# Patient Record
Sex: Female | Born: 1955
Health system: Southern US, Community
[De-identification: ages and names within clinical notes are randomized; demographics above are authoritative.]

## PROBLEM LIST (undated history)

## (undated) DIAGNOSIS — M810 Age-related osteoporosis without current pathological fracture: Secondary | ICD-10-CM

## (undated) DIAGNOSIS — G20 Parkinson's disease: Secondary | ICD-10-CM

## (undated) DIAGNOSIS — F411 Generalized anxiety disorder: Secondary | ICD-10-CM

## (undated) DIAGNOSIS — E7522 Gaucher disease: Secondary | ICD-10-CM

## (undated) HISTORY — PX: AUGMENTATION MAMMAPLASTY: SUR837

## (undated) HISTORY — PX: OTHER SURGICAL HISTORY: SHX169

## (undated) HISTORY — DX: Gaucher disease: E75.22

## (undated) HISTORY — DX: Age-related osteoporosis without current pathological fracture: M81.0

## (undated) HISTORY — PX: NOSE SURGERY: SHX723

## (undated) HISTORY — DX: Generalized anxiety disorder: F41.1

## (undated) HISTORY — PX: COSMETIC SURGERY: SHX468

## (undated) HISTORY — PX: ABDOMINAL HYSTERECTOMY: SHX81

---

## 2013-07-20 ENCOUNTER — Ambulatory Visit (INDEPENDENT_AMBULATORY_CARE_PROVIDER_SITE_OTHER): Payer: BC Managed Care – PPO | Admitting: Emergency Medicine

## 2013-07-20 VITALS — BP 98/62 | HR 73 | Temp 97.5°F | Resp 16 | Ht 69.0 in | Wt 128.1 lb

## 2013-07-20 DIAGNOSIS — H66009 Acute suppurative otitis media without spontaneous rupture of ear drum, unspecified ear: Secondary | ICD-10-CM

## 2013-07-20 DIAGNOSIS — H8110 Benign paroxysmal vertigo, unspecified ear: Secondary | ICD-10-CM

## 2013-07-20 MED ORDER — MECLIZINE HCL 25 MG PO TABS: 25.0000 mg | ORAL_TABLET | Freq: Three times a day (TID) | ORAL | Status: DC | PRN

## 2013-07-20 MED ORDER — AMOXICILLIN-POT CLAVULANATE 875-125 MG PO TABS: 1.0000 | ORAL_TABLET | Freq: Two times a day (BID) | ORAL | Status: DC

## 2013-07-20 MED ORDER — PSEUDOEPHEDRINE-GUAIFENESIN ER 60-600 MG PO TB12: 1.0000 | ORAL_TABLET | Freq: Two times a day (BID) | ORAL | Status: AC

## 2013-07-20 NOTE — Progress Notes (Signed)
[facility] [address] [place] [address] [phone]  Date:  07/20/2013   Name:  Caitlin Braun   DOB:  March 11, 1956   MRN:  [id number]  PCP:  No primary provider on file.    Chief Complaint: Otalgia, Dizziness and Tinnitus   History of Present Illness:  Caitlin Braun is a 58 y.o. very pleasant female patient who presents with the following:  Ill for a week.  Treated at [facility] for strep screen positive with Pen VK.  Improved but developed pressure in ears and moved into only right ear with pressure and fullness.  Became dizzy.  Worse with change in axis of head.  No fever or chills.  No nausea or vomiting.  No cough or coryza.  No improvement with over the counter medications or other home remedies. Denies other complaint or health concern today.   There are no active problems to display for this patient.   No past medical history on file.  Past Surgical History  Procedure Laterality Date  . Cosmetic surgery    . Abdominal hysterectomy      History  Substance Use Topics  . Smoking status: Never Smoker   . Smokeless tobacco: Not on file  . Alcohol Use: Yes    Family History  Problem Relation Age of Onset  . Cancer Mother   . Cancer Father   . Cancer Brother     No Known Allergies  Medication list has been reviewed and updated.  No current outpatient prescriptions on file prior to visit.   No current facility-administered medications on file prior to visit.    Review of Systems:  As per HPI, otherwise negative.    Physical Examination: Filed Vitals:   07/20/13 1544  BP: 98/62  Pulse: 73  Temp: 97.5 F (36.4 C)  Resp: 16   Filed Vitals:   07/20/13 1544  Height: 5\' 9"  (1.753 m)  Weight: 128 lb 1.6 oz (58.106 kg)   Body mass index is 18.91 kg/(m^2). Ideal Body Weight: Weight in (lb) to have BMI = 25: 168.9  GEN: WDWN, NAD, Non-toxic, A & O x 3  HEENT: Atraumatic, Normocephalic. Neck supple. No masses, No LAD. Ears  and Nose: No external deformity.  Right TM bulging but not red CV: RRR, No M/G/R. No JVD. No thrill. No extra heart sounds. PULM: CTA B, no wheezes, crackles, rhonchi. No retractions. No resp. distress. No accessory muscle use. ABD: S, NT, ND, +BS. No rebound. No HSM. EXTR: No c/c/e NEURO Normal gait. Intact romberg.  Impaired tandem gait.  PRRERLA EOMI CN2-12 intact gross motor intact PSYCH: Normally interactive. Conversant. Not depressed or anxious appearing.  Calm demeanor.    Assessment and Plan: Otitis media mucinex d augmentin BPV antivert  Signed,  Ellison Carwin, MD

## 2013-07-20 NOTE — Patient Instructions (Signed)

## 2014-09-08 ENCOUNTER — Other Ambulatory Visit: Payer: Self-pay

## 2014-09-08 ENCOUNTER — Other Ambulatory Visit: Payer: Self-pay | Admitting: Specialist

## 2014-09-08 ENCOUNTER — Other Ambulatory Visit: Payer: Self-pay | Admitting: Obstetrics and Gynecology

## 2014-09-08 DIAGNOSIS — Z1231 Encounter for screening mammogram for malignant neoplasm of breast: Secondary | ICD-10-CM

## 2014-09-13 ENCOUNTER — Encounter (INDEPENDENT_AMBULATORY_CARE_PROVIDER_SITE_OTHER): Payer: Self-pay

## 2014-09-13 ENCOUNTER — Ambulatory Visit
Admission: RE | Admit: 2014-09-13 | Discharge: 2014-09-13 | Disposition: A | Discharge status: Home / Self Care | Payer: BLUE CROSS/BLUE SHIELD | Source: Ambulatory Visit

## 2014-09-13 DIAGNOSIS — Z1231 Encounter for screening mammogram for malignant neoplasm of breast: Secondary | ICD-10-CM

## 2015-05-28 ENCOUNTER — Other Ambulatory Visit: Payer: Self-pay | Admitting: Obstetrics and Gynecology

## 2015-05-28 DIAGNOSIS — N951 Menopausal and female climacteric states: Secondary | ICD-10-CM

## 2015-06-14 ENCOUNTER — Ambulatory Visit
Admission: RE | Admit: 2015-06-14 | Discharge: 2015-06-14 | Disposition: A | Discharge status: Home / Self Care | Payer: 59 | Source: Ambulatory Visit | Attending: Obstetrics and Gynecology | Admitting: Obstetrics and Gynecology

## 2015-06-14 DIAGNOSIS — N951 Menopausal and female climacteric states: Secondary | ICD-10-CM

## 2015-08-15 ENCOUNTER — Other Ambulatory Visit: Payer: Self-pay

## 2015-08-15 DIAGNOSIS — Z1231 Encounter for screening mammogram for malignant neoplasm of breast: Secondary | ICD-10-CM

## 2015-09-18 ENCOUNTER — Ambulatory Visit
Admission: RE | Admit: 2015-09-18 | Discharge: 2015-09-18 | Disposition: A | Discharge status: Home / Self Care | Payer: 59 | Source: Ambulatory Visit

## 2015-09-18 DIAGNOSIS — Z1231 Encounter for screening mammogram for malignant neoplasm of breast: Secondary | ICD-10-CM

## 2016-05-29 DIAGNOSIS — D224 Melanocytic nevi of scalp and neck: Secondary | ICD-10-CM | POA: Diagnosis not present

## 2016-05-29 DIAGNOSIS — D225 Melanocytic nevi of trunk: Secondary | ICD-10-CM | POA: Diagnosis not present

## 2016-05-29 DIAGNOSIS — L608 Other nail disorders: Secondary | ICD-10-CM | POA: Diagnosis not present

## 2016-05-29 DIAGNOSIS — D1801 Hemangioma of skin and subcutaneous tissue: Secondary | ICD-10-CM | POA: Diagnosis not present

## 2016-05-29 DIAGNOSIS — B351 Tinea unguium: Secondary | ICD-10-CM | POA: Diagnosis not present

## 2016-06-19 DIAGNOSIS — N390 Urinary tract infection, site not specified: Secondary | ICD-10-CM | POA: Diagnosis not present

## 2016-06-19 DIAGNOSIS — N760 Acute vaginitis: Secondary | ICD-10-CM | POA: Diagnosis not present

## 2016-07-01 ENCOUNTER — Other Ambulatory Visit (HOSPITAL_COMMUNITY): Payer: Self-pay | Admitting: Obstetrics and Gynecology

## 2016-07-01 ENCOUNTER — Ambulatory Visit (HOSPITAL_COMMUNITY)
Admission: RE | Admit: 2016-07-01 | Discharge: 2016-07-01 | Disposition: A | Discharge status: Home / Self Care | Payer: 59 | Source: Ambulatory Visit | Attending: Obstetrics and Gynecology | Admitting: Obstetrics and Gynecology

## 2016-07-01 ENCOUNTER — Other Ambulatory Visit (HOSPITAL_COMMUNITY): Payer: Self-pay

## 2016-07-01 DIAGNOSIS — Z01419 Encounter for gynecological examination (general) (routine) without abnormal findings: Secondary | ICD-10-CM | POA: Diagnosis not present

## 2016-07-01 DIAGNOSIS — Z801 Family history of malignant neoplasm of trachea, bronchus and lung: Secondary | ICD-10-CM | POA: Diagnosis not present

## 2016-07-01 DIAGNOSIS — Z0000 Encounter for general adult medical examination without abnormal findings: Secondary | ICD-10-CM | POA: Diagnosis not present

## 2016-08-11 ENCOUNTER — Other Ambulatory Visit: Payer: Self-pay | Admitting: Specialist

## 2016-08-11 DIAGNOSIS — Z1231 Encounter for screening mammogram for malignant neoplasm of breast: Secondary | ICD-10-CM

## 2016-09-19 ENCOUNTER — Ambulatory Visit: Payer: 59

## 2016-09-22 ENCOUNTER — Ambulatory Visit
Admission: RE | Admit: 2016-09-22 | Discharge: 2016-09-22 | Disposition: A | Discharge status: Home / Self Care | Payer: 59 | Source: Ambulatory Visit | Attending: Specialist | Admitting: Specialist

## 2016-09-22 DIAGNOSIS — Z1231 Encounter for screening mammogram for malignant neoplasm of breast: Secondary | ICD-10-CM

## 2016-10-06 DIAGNOSIS — R531 Weakness: Secondary | ICD-10-CM | POA: Diagnosis not present

## 2016-10-06 DIAGNOSIS — N390 Urinary tract infection, site not specified: Secondary | ICD-10-CM | POA: Diagnosis not present

## 2016-10-06 DIAGNOSIS — E878 Other disorders of electrolyte and fluid balance, not elsewhere classified: Secondary | ICD-10-CM | POA: Diagnosis not present

## 2016-10-06 DIAGNOSIS — E039 Hypothyroidism, unspecified: Secondary | ICD-10-CM | POA: Diagnosis not present

## 2016-10-06 DIAGNOSIS — R739 Hyperglycemia, unspecified: Secondary | ICD-10-CM | POA: Diagnosis not present

## 2016-10-13 DIAGNOSIS — M25242 Flail joint, left hand: Secondary | ICD-10-CM | POA: Diagnosis not present

## 2016-10-13 DIAGNOSIS — M25241 Flail joint, right hand: Secondary | ICD-10-CM | POA: Diagnosis not present

## 2016-10-16 DIAGNOSIS — M249 Joint derangement, unspecified: Secondary | ICD-10-CM | POA: Diagnosis not present

## 2016-10-16 DIAGNOSIS — R5383 Other fatigue: Secondary | ICD-10-CM | POA: Diagnosis not present

## 2016-10-16 DIAGNOSIS — M2550 Pain in unspecified joint: Secondary | ICD-10-CM | POA: Diagnosis not present

## 2016-11-10 DIAGNOSIS — Z719 Counseling, unspecified: Secondary | ICD-10-CM | POA: Diagnosis not present

## 2016-11-17 DIAGNOSIS — Z800 Family history of malignant neoplasm of digestive organs: Secondary | ICD-10-CM | POA: Diagnosis not present

## 2016-11-17 DIAGNOSIS — D126 Benign neoplasm of colon, unspecified: Secondary | ICD-10-CM | POA: Diagnosis not present

## 2016-11-17 DIAGNOSIS — K635 Polyp of colon: Secondary | ICD-10-CM | POA: Diagnosis not present

## 2016-11-17 DIAGNOSIS — D122 Benign neoplasm of ascending colon: Secondary | ICD-10-CM | POA: Diagnosis not present

## 2016-11-17 DIAGNOSIS — Z1211 Encounter for screening for malignant neoplasm of colon: Secondary | ICD-10-CM | POA: Diagnosis not present

## 2016-12-05 DIAGNOSIS — Z23 Encounter for immunization: Secondary | ICD-10-CM | POA: Diagnosis not present

## 2017-02-04 DIAGNOSIS — R3121 Asymptomatic microscopic hematuria: Secondary | ICD-10-CM | POA: Diagnosis not present

## 2017-03-21 DIAGNOSIS — R251 Tremor, unspecified: Secondary | ICD-10-CM | POA: Diagnosis not present

## 2017-03-25 DIAGNOSIS — R531 Weakness: Secondary | ICD-10-CM | POA: Diagnosis not present

## 2017-03-25 DIAGNOSIS — Z131 Encounter for screening for diabetes mellitus: Secondary | ICD-10-CM | POA: Diagnosis not present

## 2017-03-25 DIAGNOSIS — E7800 Pure hypercholesterolemia, unspecified: Secondary | ICD-10-CM | POA: Diagnosis not present

## 2017-04-10 DIAGNOSIS — R5383 Other fatigue: Secondary | ICD-10-CM | POA: Diagnosis not present

## 2017-04-10 DIAGNOSIS — R970 Elevated carcinoembryonic antigen [CEA]: Secondary | ICD-10-CM | POA: Diagnosis not present

## 2017-04-10 DIAGNOSIS — R531 Weakness: Secondary | ICD-10-CM | POA: Diagnosis not present

## 2017-04-10 DIAGNOSIS — N390 Urinary tract infection, site not specified: Secondary | ICD-10-CM | POA: Diagnosis not present

## 2017-04-10 DIAGNOSIS — J309 Allergic rhinitis, unspecified: Secondary | ICD-10-CM | POA: Diagnosis not present

## 2017-04-10 DIAGNOSIS — E7800 Pure hypercholesterolemia, unspecified: Secondary | ICD-10-CM | POA: Diagnosis not present

## 2017-05-01 DIAGNOSIS — R21 Rash and other nonspecific skin eruption: Secondary | ICD-10-CM | POA: Diagnosis not present

## 2017-05-01 DIAGNOSIS — L814 Other melanin hyperpigmentation: Secondary | ICD-10-CM | POA: Diagnosis not present

## 2017-05-05 DIAGNOSIS — J3089 Other allergic rhinitis: Secondary | ICD-10-CM | POA: Diagnosis not present

## 2017-05-05 DIAGNOSIS — J301 Allergic rhinitis due to pollen: Secondary | ICD-10-CM | POA: Diagnosis not present

## 2017-05-05 DIAGNOSIS — H1045 Other chronic allergic conjunctivitis: Secondary | ICD-10-CM | POA: Diagnosis not present

## 2017-05-06 DIAGNOSIS — Z23 Encounter for immunization: Secondary | ICD-10-CM | POA: Diagnosis not present

## 2017-06-02 DIAGNOSIS — D2262 Melanocytic nevi of left upper limb, including shoulder: Secondary | ICD-10-CM | POA: Diagnosis not present

## 2017-06-02 DIAGNOSIS — L821 Other seborrheic keratosis: Secondary | ICD-10-CM | POA: Diagnosis not present

## 2017-06-02 DIAGNOSIS — L720 Epidermal cyst: Secondary | ICD-10-CM | POA: Diagnosis not present

## 2017-06-05 DIAGNOSIS — H00011 Hordeolum externum right upper eyelid: Secondary | ICD-10-CM | POA: Diagnosis not present

## 2017-06-24 DIAGNOSIS — N3281 Overactive bladder: Secondary | ICD-10-CM | POA: Diagnosis not present

## 2017-06-24 DIAGNOSIS — R3121 Asymptomatic microscopic hematuria: Secondary | ICD-10-CM | POA: Diagnosis not present

## 2017-06-24 DIAGNOSIS — Z23 Encounter for immunization: Secondary | ICD-10-CM | POA: Diagnosis not present

## 2017-07-03 DIAGNOSIS — N3281 Overactive bladder: Secondary | ICD-10-CM | POA: Diagnosis not present

## 2017-07-08 ENCOUNTER — Other Ambulatory Visit: Payer: Self-pay | Admitting: Obstetrics and Gynecology

## 2017-07-08 DIAGNOSIS — N952 Postmenopausal atrophic vaginitis: Secondary | ICD-10-CM | POA: Diagnosis not present

## 2017-07-08 DIAGNOSIS — N6002 Solitary cyst of left breast: Secondary | ICD-10-CM

## 2017-07-08 DIAGNOSIS — Z01419 Encounter for gynecological examination (general) (routine) without abnormal findings: Secondary | ICD-10-CM | POA: Diagnosis not present

## 2017-07-10 ENCOUNTER — Ambulatory Visit
Admission: RE | Admit: 2017-07-10 | Discharge: 2017-07-10 | Disposition: A | Discharge status: Home / Self Care | Payer: 59 | Source: Ambulatory Visit | Attending: Obstetrics and Gynecology | Admitting: Obstetrics and Gynecology

## 2017-07-10 DIAGNOSIS — N6002 Solitary cyst of left breast: Secondary | ICD-10-CM

## 2017-07-10 DIAGNOSIS — R3121 Asymptomatic microscopic hematuria: Secondary | ICD-10-CM | POA: Diagnosis not present

## 2017-07-10 DIAGNOSIS — N6489 Other specified disorders of breast: Secondary | ICD-10-CM | POA: Diagnosis not present

## 2017-07-10 DIAGNOSIS — R922 Inconclusive mammogram: Secondary | ICD-10-CM | POA: Diagnosis not present

## 2017-08-21 ENCOUNTER — Other Ambulatory Visit: Payer: Self-pay | Admitting: Obstetrics and Gynecology

## 2017-08-21 DIAGNOSIS — Z1231 Encounter for screening mammogram for malignant neoplasm of breast: Secondary | ICD-10-CM

## 2017-09-25 ENCOUNTER — Ambulatory Visit
Admission: RE | Admit: 2017-09-25 | Discharge: 2017-09-25 | Disposition: A | Discharge status: Home / Self Care | Payer: 59 | Source: Ambulatory Visit | Attending: Obstetrics and Gynecology | Admitting: Obstetrics and Gynecology

## 2017-09-25 DIAGNOSIS — Z1231 Encounter for screening mammogram for malignant neoplasm of breast: Secondary | ICD-10-CM | POA: Diagnosis not present

## 2017-09-29 ENCOUNTER — Other Ambulatory Visit: Payer: Self-pay | Admitting: Obstetrics and Gynecology

## 2017-09-29 DIAGNOSIS — R928 Other abnormal and inconclusive findings on diagnostic imaging of breast: Secondary | ICD-10-CM

## 2017-10-02 ENCOUNTER — Ambulatory Visit
Admission: RE | Admit: 2017-10-02 | Discharge: 2017-10-02 | Disposition: A | Discharge status: Home / Self Care | Payer: 59 | Source: Ambulatory Visit | Attending: Obstetrics and Gynecology | Admitting: Obstetrics and Gynecology

## 2017-10-02 DIAGNOSIS — R928 Other abnormal and inconclusive findings on diagnostic imaging of breast: Secondary | ICD-10-CM

## 2017-10-02 DIAGNOSIS — N6489 Other specified disorders of breast: Secondary | ICD-10-CM | POA: Diagnosis not present

## 2017-10-20 DIAGNOSIS — H10502 Unspecified blepharoconjunctivitis, left eye: Secondary | ICD-10-CM | POA: Diagnosis not present

## 2017-10-28 DIAGNOSIS — M249 Joint derangement, unspecified: Secondary | ICD-10-CM | POA: Diagnosis not present

## 2017-10-28 DIAGNOSIS — M2550 Pain in unspecified joint: Secondary | ICD-10-CM | POA: Diagnosis not present

## 2017-10-28 DIAGNOSIS — R5383 Other fatigue: Secondary | ICD-10-CM | POA: Diagnosis not present

## 2017-10-30 DIAGNOSIS — Z23 Encounter for immunization: Secondary | ICD-10-CM | POA: Diagnosis not present

## 2017-11-17 DIAGNOSIS — H04123 Dry eye syndrome of bilateral lacrimal glands: Secondary | ICD-10-CM | POA: Diagnosis not present

## 2017-12-11 DIAGNOSIS — Z23 Encounter for immunization: Secondary | ICD-10-CM | POA: Diagnosis not present

## 2017-12-22 DIAGNOSIS — R300 Dysuria: Secondary | ICD-10-CM | POA: Diagnosis not present

## 2018-01-13 DIAGNOSIS — M79601 Pain in right arm: Secondary | ICD-10-CM | POA: Diagnosis not present

## 2018-01-13 DIAGNOSIS — R29898 Other symptoms and signs involving the musculoskeletal system: Secondary | ICD-10-CM | POA: Diagnosis not present

## 2018-01-13 DIAGNOSIS — R200 Anesthesia of skin: Secondary | ICD-10-CM | POA: Diagnosis not present

## 2018-01-15 DIAGNOSIS — R200 Anesthesia of skin: Secondary | ICD-10-CM | POA: Diagnosis not present

## 2018-01-15 DIAGNOSIS — M79601 Pain in right arm: Secondary | ICD-10-CM | POA: Diagnosis not present

## 2018-01-15 DIAGNOSIS — R29898 Other symptoms and signs involving the musculoskeletal system: Secondary | ICD-10-CM | POA: Diagnosis not present

## 2018-01-27 DIAGNOSIS — M5416 Radiculopathy, lumbar region: Secondary | ICD-10-CM | POA: Diagnosis not present

## 2018-01-27 DIAGNOSIS — M542 Cervicalgia: Secondary | ICD-10-CM | POA: Diagnosis not present

## 2018-01-27 DIAGNOSIS — R29898 Other symptoms and signs involving the musculoskeletal system: Secondary | ICD-10-CM | POA: Diagnosis not present

## 2018-01-27 DIAGNOSIS — M545 Low back pain: Secondary | ICD-10-CM | POA: Diagnosis not present

## 2018-02-24 DIAGNOSIS — R29898 Other symptoms and signs involving the musculoskeletal system: Secondary | ICD-10-CM | POA: Diagnosis not present

## 2018-02-24 DIAGNOSIS — M5416 Radiculopathy, lumbar region: Secondary | ICD-10-CM | POA: Diagnosis not present

## 2018-02-24 DIAGNOSIS — M545 Low back pain: Secondary | ICD-10-CM | POA: Diagnosis not present

## 2018-03-02 ENCOUNTER — Encounter: Payer: Self-pay | Admitting: *Deleted

## 2018-03-02 ENCOUNTER — Encounter: Payer: Self-pay | Admitting: Neurology

## 2018-03-02 ENCOUNTER — Ambulatory Visit: Payer: 59 | Admitting: Neurology

## 2018-03-02 VITALS — BP 91/63 | HR 80 | Ht 70.0 in | Wt 121.0 lb

## 2018-03-02 DIAGNOSIS — R200 Anesthesia of skin: Secondary | ICD-10-CM

## 2018-03-02 DIAGNOSIS — E519 Thiamine deficiency, unspecified: Secondary | ICD-10-CM

## 2018-03-02 DIAGNOSIS — G8929 Other chronic pain: Secondary | ICD-10-CM

## 2018-03-02 DIAGNOSIS — M542 Cervicalgia: Secondary | ICD-10-CM

## 2018-03-02 DIAGNOSIS — M79644 Pain in right finger(s): Secondary | ICD-10-CM

## 2018-03-02 DIAGNOSIS — G20C Parkinsonism, unspecified: Secondary | ICD-10-CM

## 2018-03-02 DIAGNOSIS — M6281 Muscle weakness (generalized): Secondary | ICD-10-CM | POA: Diagnosis not present

## 2018-03-02 DIAGNOSIS — R270 Ataxia, unspecified: Secondary | ICD-10-CM

## 2018-03-02 DIAGNOSIS — Q8740 Marfan's syndrome, unspecified: Secondary | ICD-10-CM

## 2018-03-02 DIAGNOSIS — G20 Parkinson's disease: Secondary | ICD-10-CM

## 2018-03-02 DIAGNOSIS — R011 Cardiac murmur, unspecified: Secondary | ICD-10-CM

## 2018-03-02 DIAGNOSIS — R5382 Chronic fatigue, unspecified: Secondary | ICD-10-CM

## 2018-03-02 DIAGNOSIS — G1229 Other motor neuron disease: Secondary | ICD-10-CM

## 2018-03-02 DIAGNOSIS — R202 Paresthesia of skin: Secondary | ICD-10-CM

## 2018-03-02 DIAGNOSIS — R531 Weakness: Secondary | ICD-10-CM | POA: Diagnosis not present

## 2018-03-02 DIAGNOSIS — M5440 Lumbago with sciatica, unspecified side: Secondary | ICD-10-CM

## 2018-03-02 DIAGNOSIS — M20001 Unspecified deformity of right finger(s): Secondary | ICD-10-CM

## 2018-03-02 DIAGNOSIS — G1221 Amyotrophic lateral sclerosis: Secondary | ICD-10-CM

## 2018-03-02 DIAGNOSIS — R251 Tremor, unspecified: Secondary | ICD-10-CM

## 2018-03-02 DIAGNOSIS — M7918 Myalgia, other site: Secondary | ICD-10-CM

## 2018-03-02 DIAGNOSIS — E531 Pyridoxine deficiency: Secondary | ICD-10-CM

## 2018-03-02 DIAGNOSIS — E538 Deficiency of other specified B group vitamins: Secondary | ICD-10-CM

## 2018-03-02 DIAGNOSIS — E559 Vitamin D deficiency, unspecified: Secondary | ICD-10-CM

## 2018-03-02 NOTE — Patient Instructions (Addendum)
MRI of the brain and cervical spine w/wo contrast please ensure to go down to T2 to see finding in vertebra on last non-contrast MRI Right rhomboid and trap tightness, heat and massage feels good. Dry needling EMG/NCS of the right arm and right leg Need to evaluate for MS given diffuse symptoms. MRI brain and cervical spine Marfan?: echocardiogram and f/u with pcp to discuss referral if clinically warranted and possibly genetic testing Consider DAT scan, very unlikely parkinson'd disease Tremor: may be essential tremor?  Extensive lab testing today Physical therapy for right-sided and pain and gait abnormality and proximal weakness: Mild generalized weakness more proximaly 4+/5 in the deltoids and hip flexors Xray of right hand

## 2018-03-02 NOTE — Progress Notes (Addendum)
[facility]    Provider:  Dr Jaynee Eagles Referring Provider: [name], MD, Dr. Erline Braun Primary Care Physician:  Caitlin Queen, MD  CC:  Right-sided numbness, tremor and tingling  Addendum 06/03/2018: DAT Scan: IMPRESSION:Loss of dopamine transport activity in the bilateral striata is a pattern typical of Parkinson's syndrome pathology  Addendum 05/20/2018: Patient with reported low back pain with radiculopathy of right leg, right arm and leg weakness and sensory changes, diffuse pain. Reviewed workup with patient today. EMG/NCS within normal limits. She still has episodes of leg tremoring but she is feeling better and if she tells herself to stop she can stop it. She also still feels symptomatic in her right arm and right leg. But symptoms happen separately so may not be related. She went to physical therapy and she loves it. She is going to start Pilates. Reviewed MRI of the brain and thoracic imaging and labwork to date with patient which is extensive (see below). She has already has MRI cervical spine and lumbar spine at [facility]. Her worst symptoms are the tingling and numbness in the prox arm and legs and the tremor. She asks me for a refill of Ambien, I will give her one refill of ambien but I do not agree with long-term use so she will have to see her primary care to discuss. Needs a referral to a sleep counselor.  Patient needs cognitive behavioral therapy for insomnia. Please refer to [facility]  MRI brain: This MRI of the brain with and without contrast shows the following:  1.   Brain parenchyma appears normal before and after contrast. 2.   7 to 8 mm focus in the right skull.  Surrounding skull appears normal.   This appears to be benign. She should repeat CT head in 6 months to a year to follow right skull focus, explained she should review with pcp and ask her to order as she may not need follow up in neurology. At this time she  is looking for a new pcp and understands it is her responsibility to follow up on this. MRI Thoracic Spine: This MRI of the thoracic spine with and without contrast shows the following: 1.   The spinal cord appears normal. 2.   Small disc protrusions at T7-T8, T8-T9 and T10-T11 that do not cause nerve root compression or spinal stenosis. 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 Showed a small pfo. Emailed Dr. Rayann Braun, no intervention needed.  Parkinsonism?: Pending DAT scan  Normal labs: Vit D, MMA, Lyme, tsh, HIV, esr, sjogrens, pan-anca, b12, folate, rpr, help c, paraneoplatic abs, celiac antibodies, heavy metals, b6, IFE/SPEP, copper, b1, cmo, cbc, ana   HPI:  Caitlin Braun is a 62 y.o. female here as requested by Dr. Erline Braun for low back pain.  She has been seen by [facility] and EMG nerve conduction study of the right upper extremity did not show any etiology for her symptoms. She has been extensively evaluated at [facility] with MRI c-spine and MRI L-spine and emg/ncs of the right arm. She has seen rheumatology. Here with her husband who provides much information. About 2 years she started feeling tired and lethargic, she was feeling joint pain diffuse. Then she started having right thumb pain and saw rheumatologist who allayed her fears about rheumatologic disease, diagnosed with hyper joints. She has had neck issues under her should blade on her right side and tingles and radiates down the right arm to the thumb. Massage and  heat helps. She has some tremor in the right arm. Also when she sits on the toilet her right leg tremors. Also her handwriting is affected. She has weakness in the right hand, her hand goes numb, she can hardly write. She has weakness in the right hand. No problems with smell or taste, but he appetite has decreased and taste has changed per husband. No drooling or wet pillows at night. She has imbalance. No REM sleep disorder. No falls. She denies  incontinence, She mumbles a lot this is not new but husband has hearing difficulties, no hypophonia. Husband has noticed some shuffling. No changes in facial expressions. She has back pain and radiation into the right leg. Numbness in the right leg. No other focal neurologic deficits, associated symptoms, inciting events or modifiable factors.  Reviewed notes, labs and imaging from outside physicians, which showed:  Reviewed notes from Dr. Vertell Limber in his office neurosurgery and spine.:  Patient has been experiencing right upper extremity neck pain and also numbness and tingling in digits 1 and 2.  Diminished dexterity in the right hand.  Past medical history is otherwise noncontributory.  She has right shoulder right arm and right some numbness and tingling.  Numbness is exacerbated by use.  She denies any neck pain.  She also notes some right leg numbness and tingling that begins in her right head and extends into her right outer toes.  She is noticed balance issues and handwriting issues over the last several months.  She also reports tremors in her right hand.  Her rheumatologist diagnosed hyperflexible right thumb and elevate her concern for autoimmune process.  On exam her neurologic exam is significant for decreased sensory in the right C6 distribution, Spurling's positive on the right, Tinel's positive on the right, Phalen's positive on the right.  She does have 4 out of 5 right hand intrinsics weakness and decreased pin sensitivity in the first and second digits on the right otherwise negative straight leg raise negative right sciatic notch discomfort.  Reviewed MRI of the cervical spine report I do not have imaging.  Impression is L4-L5 bilateral facet Orth osteoarthritis allowing 2 mm of anterolisthesis.  Mild bulging of the disc.  No compressive stenosis, the back arthritis would be a cause of back pain or referred pain.  Chronic appearing degenerative changes of the discs at L3-L4 and L5-S1 but  without herniation.  No stenosis or neural compression.  Exam date January 27, 2018.  MRI of the cervical spine reviewed report I do not have the images available: No significant degenerative changes, no stenosis or neural compression, no abnormality seen to explain neck pain and right arm symptoms.  T2 bright focus within the anterior right T1 vertebral body which may be a benign cyst would recommend, suggest a contrast-enhanced examination to rule out enhancing marrow space lesion.  Labs include normal thyroid panel, BUN 16 and creatinine 0.58 collected 03/25/2017, ANA negative, rheumatoid arthritis negative, sed rate negative, CEA normal, CA normal CBC was unremarkable April 11, 2017 except for mildly low platelets at 136.  Reviewed data and results of electrodiagnostic testing summary includes right upper extremity was within normal limits with no evidence for cervical radiculopathy, brachial plexopathy, peripheral median or ulnar neuropathy as well as polyneuropathy.  Per the note she could be experiencing some cervical radiculitis is not manifesting on electrodiagnostic testing, recommended MRI cervical spine.  Also recommended possibly repeating in the future.  Due to right arm numbness right arm weakness and right arm  pain.     Review of Systems: Patient complains of symptoms per HPI as well as the following symptoms: numbness, weakness, insomnia, ringing in ears, tremor, aching muscles . Pertinent negatives and positives per HPI. All others negative.   Social History   Socioeconomic History  . Marital status: Married    Spouse name: Not on file  . Number of children: 3  . Years of education: Not on file  . Highest education level: Bachelor's degree (e.g., BA, AB, BS)  Occupational History  . Not on file  Social Needs  . Financial resource strain: Not on file  . Food insecurity:    Worry: Not on file    Inability: Not on file  . Transportation needs:    Medical: Not on file     Non-medical: Not on file  Tobacco Use  . Smoking status: Never Smoker  . Smokeless tobacco: Never Used  Substance and Sexual Activity  . Alcohol use: Yes    Alcohol/week: 3.0 standard drinks    Types: 3 Standard drinks or equivalent per week  . Drug use: No  . Sexual activity: Not on file  Lifestyle  . Physical activity:    Days per week: Not on file    Minutes per session: Not on file  . Stress: Not on file  Relationships  . Social connections:    Talks on phone: Not on file    Gets together: Not on file    Attends religious service: Not on file    Active member of club or organization: Not on file    Attends meetings of clubs or organizations: Not on file    Relationship status: Not on file  . Intimate partner violence:    Fear of current or ex partner: Not on file    Emotionally abused: Not on file    Physically abused: Not on file    Forced sexual activity: Not on file  Other Topics Concern  . Not on file  Social History Narrative   Lives at home with husband    Family History  Problem Relation Age of Onset  . Cancer Mother   . Cancer Father   . Cancer Brother   . Breast cancer Neg Hx   . Parkinson's disease Neg Hx     Past Medical History:  Diagnosis Date  . Osteoporosis     Past Surgical History:  Procedure Laterality Date  . ABDOMINAL HYSTERECTOMY    . AUGMENTATION MAMMAPLASTY Bilateral 2016, 1998   Patient had them redone in 2016  . COSMETIC SURGERY    . face lift    . NOSE SURGERY     cosmetic    Current Outpatient Medications  Medication Sig Dispense Refill  . ALPRAZolam (XANAX) 0.25 MG tablet Take 0.25 mg by mouth as needed.     . Biotin w/ Vitamins C & E (HAIR/SKIN/NAILS PO) Take by mouth.    . Calcium Carb-Cholecalciferol (CALCIUM + D3 PO) Take 500 mg by mouth daily.    . Cholecalciferol (VITAMIN D3 PO) Take 2,000 Int'l Units by mouth daily.    Marland Kitchen Fexofenadine-Pseudoephedrine (ALLEGRA-D 12 HOUR PO) Take 1 tablet by mouth daily.     Marland Kitchen  FLUTICASONE PROPIONATE NA Place 1 spray into both nostrils at bedtime.    Marland Kitchen ketotifen (ZADITOR) 0.025 % ophthalmic solution Place 1 drop into both eyes as needed.    Marland Kitchen KRILL OIL PO Take 500 mg by mouth daily.    Marland Kitchen MAGNESIUM PO Take by  mouth at bedtime.    . Minoxidil (ROGAINE EX) Apply 2 drops topically daily.    . montelukast (SINGULAIR) 10 MG tablet Take 10 mg by mouth at bedtime.    . Multiple Vitamin (MULTIVITAMIN) tablet Take 1 tablet by mouth daily.    . Potassium Gluconate 550 MG TABS Take 1 tablet by mouth daily.    . Probiotic Product (PROBIOTIC PO) Take by mouth.    . Wheat Dextrin (BENEFIBER PO) Take by mouth.    . zolpidem (AMBIEN) 10 MG tablet Take 10 mg by mouth at bedtime.      No current facility-administered medications for this visit.     Allergies as of 03/02/2018 - Review Complete 03/02/2018  Allergen Reaction Noted  . Augmentin [amoxicillin-pot clavulanate] Hives 03/02/2018    Vitals: BP 91/63 (BP Location: Right Arm, Patient Position: Sitting)   Pulse 80   Ht '5\' 10"'  (1.778 m)   Wt 121 lb (54.9 kg)   BMI 17.36 kg/m  Last Weight:  Wt Readings from Last 1 Encounters:  03/02/18 121 lb (54.9 kg)   Last Height:   Ht Readings from Last 1 Encounters:  03/02/18 '5\' 10"'  (1.778 m)   Physical exam: Exam: Gen: NAD, conversant, well nourised, thin,, well groomed                     CV: RRR, +SEM. No Carotid Bruits. No peripheral edema, warm, nontender Eyes: Conjunctivae clear without exudates or hemorrhage MSK: tall, thin, thin and long fingers, joint laxity  Neuro: Detailed Neurologic Exam  Speech:    Speech is normal; fluent and spontaneous with normal comprehension.  Cognition:    The patient is oriented to person, place, and time;     recent and remote memory intact;     language fluent;     normal attention, concentration,     fund of knowledge Cranial Nerves: Hypomimia    The pupils are equal, round, and reactive to light. The fundi are normal and  spontaneous venous pulsations are present. Visual fields are full to finger confrontation. Extraocular movements are intact. Trigeminal sensation is intact and the muscles of mastication are normal. Bilateral ptosis. The palate elevates in the midline. Hearing intact. Voice is normal. Shoulder shrug is normal. The tongue has normal motion without fasciculations.   Coordination:    Normal finger to nose and heel to shin. Normal rapid alternating movements.   Gait:    Heel-toe and tandem gait are normal.  Decreased arm swing.   Motor Observation:    Postural tremor Tone:    Normal muscle tone.   Posture:    Posture is normal. normal erect    Strength: Mild generalized weakness more proximaly 4+/5 in the deltoids and hip flexors    Strength is V/V in the upper and lower limbs.      Sensation: intact to LT     Reflex Exam:  DTR's: right biceps slightly increased as compared to the left.  Brisk lowers for age.     Toes:    The toes are downgoing bilaterally.   Clonus:    2 beats clonus at AJs        Assessment/Plan:  63 year old female with multiple symptoms including chronic neck and back pain, radicular symptoms, fatigue, lethargy, diffuse pain, hyer joints, neck pain muscular, tremors, worsening weakness in right arm and hand and numbness, imbalance, hypomimia, decreased arm swing, numbnes sin right leg. She has already been extensively evaluated without  causes found  Addendum 06/03/2018: DAT Scan: IMPRESSION:Loss of dopamine transport activity in the bilateral striata is a pattern typical of Parkinson's syndrome pathology  Addendum 05/20/2018: Patient with reported low back pain with radiculopathy of right leg, right arm and leg weakness and sensory changes, diffuse pain. Reviewed workup with patient today. EMG/NCS within normal limits. She still has episodes of leg tremoring but she is feeling better and if she tells herself to stop she can stop it. She also still feels  symptomatic in her right arm and right leg. But symptoms happen separately so may not be related. She went to physical therapy and she loves it. She is going to start Pilates. Reviewed MRI of the brain and thoracic imaging and labwork to date with patient which is extensive (see below). She has already has MRI cervical spine and lumbar spine at [facility]. Her worst symptoms are the tingling and numbness in the prox arm and legs and the tremor. She asks me for a refill of Ambien, I will give her one refill of ambien but I do not agree with long-term use so she will have to see her primary care to discuss. Needs a referral to a sleep counselor.  Patient needs cognitive behavioral therapy for insomnia. Please refer to [facility]  MRI brain: This MRI of the brain with and without contrast shows the following:  1.   Brain parenchyma appears normal before and after contrast. 2.   7 to 8 mm focus in the right skull.  Surrounding skull appears normal.   This appears to be benign. She should repeat CT head in 6 months to a year to follow right skull focus, explained she should review with pcp and ask her to order as she may not need follow up in neurology. At this time she is looking for a new pcp and understands it is her responsibility to follow up on this. MRI Thoracic Spine: This MRI of the thoracic spine with and without contrast shows the following: 1.   The spinal cord appears normal. 2.   Small disc protrusions at T7-T8, T8-T9 and T10-T11 that do not cause nerve root compression or spinal stenosis. 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 Showed a small pfo. Emailed Dr. Rayann Braun, no intervention needed.  Parkinsonism?: Pending DAT scan  Normal labs: Vit D, MMA, Lyme, tsh, HIV, esr, sjogrens, pan-anca, b12, folate, rpr, help c, paraneoplatic abs, celiac antibodies, heavy metals, b6, IFE/SPEP, copper, b1, cmo, cbc, ana  Initial Assessment and plan:  - Need to evaluate for  MS given diffuse symptoms. MRI brain and cervical spine w/wo contrast - MRI of the brain to evaluate for MS, stroke, masses or other lesions causing her focal neurologic symptoms. Also MRI cervical and thoracic spine w/wo contrast to evaluate for  T2 bright focus within the anterior right T1 vertebral body which may be a benign cyst but suggest a contrast-enhanced examination to rule out enhancing marrow space lesion or other lesions. - Right rhomboid and trap tightness, heat and massage feels good. Dry needling for cervical myofascial pain - EMG/NCS of the right arm and right leg - Phenotypically she appears Marfan-like?: echocardiogram and f/u with pcp to discuss referral if clinically warranted and possibly genetic testing - Consider DAT scan, parkinsonism on exam - Tremor: may be essential tremor? MRI brain and cervical spine - Extensive lab testing today - Physical therapy for right-sided and pain and gait abnormality and proximal weakness: Mild generalized weakness more proximaly 4+/5 in  the deltoids and hip flexors - A wrist splint to support right wrist and right thumb. - Xray of right hand  Orders Placed This Encounter  Procedures  . MR BRAIN W WO CONTRAST  . MR CERVICAL SPINE W WO CONTRAST  . MR THORACIC SPINE W WO CONTRAST  . DG Hand Complete Right  . Vitamin D, 25-hydroxy  . Methylmalonic acid, serum  . B. burgdorfi Antibody  . TSH  . HIV Antibody (routine testing w rflx)  . Sedimentation rate  . Sjogren's syndrome antibods(ssa + ssb)  . Pan-ANCA  . B12 and Folate Panel  . RPR  . Hepatitis C antibody  . Paraneoplastic Profile 1  . Tissue transglutaminase, IgA  . Gliadin antibodies, serum  . Heavy metals, blood  . Vitamin B6  . Multiple Myeloma Panel (SPEP&IFE w/QIG)  . Copper, serum  . Vitamin B1  . Comprehensive metabolic panel  . CBC  . ANA,IFA RA Diag Pnl w/rflx Tit/Patn  . Ambulatory referral to Physical Therapy  . Ambulatory referral to Physical Therapy  .  ECHOCARDIOGRAM COMPLETE BUBBLE STUDY  . NCV with EMG(electromyography)    Cc: Caitlin Queen, MD, Dr. Erline Braun  A total of 120 minutes was spent face-to-face with this patient. Over half this time was spent on counseling patient on the  1. Right sided weakness   2. Numbness and tingling of right arm and leg   3. Parkinsonism, unspecified Parkinsonism type ([place])   4. Muscle weakness (generalized)   5. Tremors of nervous system   6. Deformity of right thumb joint   7. Bilateral low back pain with sciatica, sciatica laterality unspecified, unspecified chronicity   8. Myofascial pain syndrome, cervical   9. Chronic pain of right thumb   10. Chronic neck pain   11. Paresthesias   12. B12 deficiency   13. Chronic fatigue   14. Vitamin B1 deficiency   15. Vitamin B6 deficiency   16. Vitamin D deficiency   17. 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 Marfan syndrome   20. Murmur, cardiac     diagnosis and different diagnostic and therapeutic options, counseling and coordination of care, risks ans benefits of management, compliance, or risk factor reduction and education.      Sarina Ill, MD  [facility] [address] [place], [place] [address]  Phone [phone] Fax [phone]

## 2018-03-02 NOTE — Addendum Note (Signed)
Addended by: Sarina Ill B on: 03/02/2018 09:40 PM   Modules accepted: Orders

## 2018-03-08 ENCOUNTER — Telehealth: Payer: Self-pay | Admitting: Neurology

## 2018-03-08 NOTE — Telephone Encounter (Signed)
The Brain was approved the Thoracic is pending I faxed clinical notes for that one.  But the Cervical can be approved if the order is switch to MRI Cervical wo contrast. Do you want to change the order or proceed with me faxing the clinical notes for the MRI cervical w/wo contrast?

## 2018-03-09 NOTE — Telephone Encounter (Signed)
Okay the Thoracic is still pending. I am waiting on the nurse to review the case.

## 2018-03-09 NOTE — Telephone Encounter (Signed)
I don;t need a cervical without contrast, she already had one. I'd cancel the cervical and see if they approve the thoracic with contrast thanks

## 2018-03-09 NOTE — Telephone Encounter (Signed)
Since [facility] would approve the Cervical wo contrast I switch the CPT code with them to get it approved . When you get a chance can you put a new order in for Cervical wo contrast.

## 2018-03-10 ENCOUNTER — Other Ambulatory Visit (HOSPITAL_COMMUNITY): Payer: 59

## 2018-03-10 ENCOUNTER — Telehealth: Payer: Self-pay | Admitting: *Deleted

## 2018-03-10 LAB — VITAMIN B1: Thiamine: 115 nmol/L (ref 66.5–200.0)

## 2018-03-10 LAB — PARANEOPLASTIC PROFILE 1
Neuronal Nuc Ab (Ri), IFA: <1:10 {titer}
Purkinje Cell (Yo) Autoantobodies- IFA: <1:10 {titer}

## 2018-03-10 LAB — ANA,IFA RA DIAG PNL W/RFLX TIT/PATN
ANA Titer 1: NEGATIVE
CYCLIC CITRULLIN PEPTIDE AB: 14 U (ref 0–19)
Rhuematoid fact SerPl-aCnc: 11.3 IU/mL (ref 0.0–13.9)

## 2018-03-10 LAB — VITAMIN B6: VITAMIN B6: 19.7 ug/L (ref 2.0–32.8)

## 2018-03-10 LAB — MULTIPLE MYELOMA PANEL, SERUM
ALBUMIN SERPL ELPH-MCNC: 4.5 g/dL — AB (ref 2.9–4.4)
ALPHA 1: 0.2 g/dL (ref 0.0–0.4)
ALPHA2 GLOB SERPL ELPH-MCNC: 0.6 g/dL (ref 0.4–1.0)
Albumin/Glob SerPl: 1.6 (ref 0.7–1.7)
B-Globulin SerPl Elph-Mcnc: 0.8 g/dL (ref 0.7–1.3)
GAMMA GLOB SERPL ELPH-MCNC: 1.5 g/dL (ref 0.4–1.8)
Globulin, Total: 3 g/dL (ref 2.2–3.9)
IGG (IMMUNOGLOBIN G), SERUM: 1273 mg/dL (ref 700–1600)
IGM (IMMUNOGLOBULIN M), SRM: 333 mg/dL — AB (ref 26–217)
IgA/Immunoglobulin A, Serum: 163 mg/dL (ref 87–352)

## 2018-03-10 LAB — CBC
HEMATOCRIT: 36.5 % (ref 34.0–46.6)
HEMOGLOBIN: 11.8 g/dL (ref 11.1–15.9)
MCH: 28.4 pg (ref 26.6–33.0)
MCHC: 32.3 g/dL (ref 31.5–35.7)
MCV: 88 fL (ref 79–97)
PLATELETS: 146 10*3/uL — AB (ref 150–450)
RBC: 4.16 x10E6/uL (ref 3.77–5.28)
RDW: 13.7 % (ref 12.3–15.4)
WBC: 5.3 10*3/uL (ref 3.4–10.8)

## 2018-03-10 LAB — COMPREHENSIVE METABOLIC PANEL
ALBUMIN: 5 g/dL — AB (ref 3.6–4.8)
ALT: 13 IU/L (ref 0–32)
AST: 21 IU/L (ref 0–40)
Albumin/Globulin Ratio: 2 (ref 1.2–2.2)
Alkaline Phosphatase: 66 IU/L (ref 39–117)
BUN/Creatinine Ratio: 28 (ref 12–28)
BUN: 17 mg/dL (ref 8–27)
Bilirubin Total: 0.9 mg/dL (ref 0.0–1.2)
CALCIUM: 9.9 mg/dL (ref 8.7–10.3)
CO2: 25 mmol/L (ref 20–29)
CREATININE: 0.6 mg/dL (ref 0.57–1.00)
Chloride: 100 mmol/L (ref 96–106)
GFR calc Af Amer: 113 mL/min/{1.73_m2} (ref >59)
GFR, EST NON AFRICAN AMERICAN: 98 mL/min/{1.73_m2} (ref >59)
Globulin, Total: 2.5 g/dL (ref 1.5–4.5)
Glucose: 87 mg/dL (ref 65–99)
Potassium: 4.2 mmol/L (ref 3.5–5.2)
SODIUM: 141 mmol/L (ref 134–144)
TOTAL PROTEIN: 7.5 g/dL (ref 6.0–8.5)

## 2018-03-10 LAB — HEAVY METALS, BLOOD
ARSENIC: 8 ug/L (ref 2–23)
LEAD, BLOOD: NOT DETECTED ug/dL (ref 0–4)
MERCURY: 1 ug/L (ref 0.0–14.9)

## 2018-03-10 LAB — HIV ANTIBODY (ROUTINE TESTING W REFLEX): HIV Screen 4th Generation wRfx: NONREACTIVE

## 2018-03-10 LAB — HEPATITIS C ANTIBODY

## 2018-03-10 LAB — GLIADIN ANTIBODIES, SERUM
Antigliadin Abs, IgA: 4 units (ref 0–19)
Gliadin IgG: 3 units (ref 0–19)

## 2018-03-10 LAB — B12 AND FOLATE PANEL
FOLATE: 13.7 ng/mL (ref >3.0)
Vitamin B-12: 1070 pg/mL (ref 232–1245)

## 2018-03-10 LAB — RPR: RPR: NONREACTIVE

## 2018-03-10 LAB — PAN-ANCA
Atypical pANCA: <1:20 {titer}
C-ANCA: <1:20 {titer}

## 2018-03-10 LAB — METHYLMALONIC ACID, SERUM: Methylmalonic Acid: 78 nmol/L (ref 0–378)

## 2018-03-10 LAB — VITAMIN D 25 HYDROXY (VIT D DEFICIENCY, FRACTURES): Vit D, 25-Hydroxy: 37.9 ng/mL (ref 30.0–100.0)

## 2018-03-10 LAB — SJOGREN'S SYNDROME ANTIBODS(SSA + SSB)

## 2018-03-10 LAB — SEDIMENTATION RATE: SED RATE: 9 mm/h (ref 0–40)

## 2018-03-10 LAB — COPPER, SERUM: Copper: 106 ug/dL (ref 72–166)

## 2018-03-10 LAB — B. BURGDORFI ANTIBODIES: Lyme IgG/IgM Ab: <0.91 {ISR} (ref 0.00–0.90)

## 2018-03-10 LAB — TSH: TSH: 2.74 u[IU]/mL (ref 0.450–4.500)

## 2018-03-10 LAB — TISSUE TRANSGLUTAMINASE, IGA

## 2018-03-10 NOTE — Telephone Encounter (Signed)
-----   Message from Melvenia Beam, MD sent at 03/07/2018  9:09 PM EST ----- Labs so far unremarkable. There a few pending will call if they are abnormal. thanks

## 2018-03-10 NOTE — Telephone Encounter (Signed)
I called [employer] to check the status on the Thoracic it is in the final stage of medical review.

## 2018-03-10 NOTE — Telephone Encounter (Signed)
Called pt & LVM (ok per DPR) informing pt that so far labs are unremarkable. A few more are pending and we will call if they are abnormal. Left office number in case pt had anymore questions.

## 2018-03-11 NOTE — Telephone Encounter (Signed)
YES. Thank you.

## 2018-03-11 NOTE — Telephone Encounter (Signed)
Thoracic Josem Kaufmann: [id number] (exp. 03/10/18 to 04/24/18)  Since the Thoracic spine w/wo contrast was approved Do you want her to proceed with the Thoracic and Brain?

## 2018-03-12 NOTE — Telephone Encounter (Signed)
Spoke with pt and advised her that all lab results have been sent to her mychart account. Pt encouraged to look at all of them as there were several and if she has any questions she can send Dr. Jaynee Eagles a message through the portal. Advised that Dr. Jaynee Eagles ruled out lyme, thyroid disorder, infectious causes, gluten sensitivity, certain blood cancer, etc. Pt was very appreciative and stated she would look at them.

## 2018-03-12 NOTE — Telephone Encounter (Signed)
I spoke to the patient she is scheduled her Brain and Thoracic for 03/24/18 at [facility].  She also wants to know exactly what type of blood test was taking that came back normal.

## 2018-03-17 ENCOUNTER — Ambulatory Visit: Payer: 59 | Attending: Neurology | Admitting: Physical Therapy

## 2018-03-17 ENCOUNTER — Other Ambulatory Visit: Payer: Self-pay

## 2018-03-17 ENCOUNTER — Encounter: Payer: Self-pay | Admitting: Physical Therapy

## 2018-03-17 DIAGNOSIS — R209 Unspecified disturbances of skin sensation: Secondary | ICD-10-CM | POA: Diagnosis not present

## 2018-03-17 DIAGNOSIS — M6281 Muscle weakness (generalized): Secondary | ICD-10-CM | POA: Diagnosis not present

## 2018-03-17 NOTE — Therapy (Signed)
[place], [place], [address] Phone: [phone]   Fax:  [phone]  Physical Therapy Evaluation  Patient Details  Name: Caitlin Braun MRN: [id number] Date of Birth: 1956-03-17 Referring Provider (PT): Sarina Ill, MD   Encounter Date: 03/17/2018  PT End of Session - 03/17/18 1439    Visit Number  1    Number of Visits  13    Date for PT Re-Evaluation  04/30/18    Authorization Type  UHC    PT Start Time  5643    PT Stop Time  1505    PT Time Calculation (min)  45 min    Activity Tolerance  Patient tolerated treatment well    Behavior During Therapy  [facility] for tasks assessed/performed       Past Medical History:  Diagnosis Date  . Osteoporosis     Past Surgical History:  Procedure Laterality Date  . ABDOMINAL HYSTERECTOMY    . AUGMENTATION MAMMAPLASTY Bilateral 2016, 1998   Patient had them redone in 2016  . COSMETIC SURGERY    . face lift    . NOSE SURGERY     cosmetic    There were no vitals filed for this visit.   Subjective Assessment - 03/17/18 1425    Subjective  About a year ago began having on and off again weakness that progressed. Tremor noted in Right hand when reaching, not at rest. General weakness noted on Right side. gnawing pain along sciatic. neck down right arm pain. 3 days a week work from home- always on a computer. Denies HA or neck pain. constant tinging at inferior scapula on right side that radiates down thenar aspect of right arm. A few months ago grip has gotten weak. Able to actively move arm. Difficulty doing buttonts and taking shirts overhead, handwriting is poor. Loses balance when donning pants. MRI on Wednesday.     Patient Stated Goals  build strength    Currently in Pain?  No/denies   more annoying        [facility] PT Assessment - 03/17/18 0001      Assessment   Medical Diagnosis  myofasical pain, generalized weakness    Referring Provider (PT)  Sarina Ill, MD    Onset Date/Surgical Date  --   about a year   Hand Dominance  Right    Prior Therapy  no      Precautions   Precaution Comments  osteoporosis      Restrictions   Weight Bearing Restrictions  No      Balance Screen   Has the patient fallen in the past 6 months  No      [place] residence    Living Arrangements  Spouse/significant other    Additional Comments  stairs at home, bedroom downstairs      Prior Function   Level of Independence  Independent    Vocation  Full time employment    Vocation Requirements  sits at a desk and uses computer    Leisure  gym, yoga      Cognition   Overall Cognitive Status  Within Functional Limits for tasks assessed      Observation/Other Assessments   Focus on Therapeutic Outcomes (FOTO)   --   wrong body part     Sensation   Additional Comments  rarely hypersensitive      Posture/Postural Control   Posture Comments  bil winging  scapula- worse on Rt, Rt GHJ depression      ROM / Strength   AROM / PROM / Strength  AROM;Strength      AROM   Overall AROM Comments  WFL      Strength   Overall Strength Comments  grip Rt 40 30 35, Left 35 30 30    Strength Assessment Site  --    Right/Left Shoulder  Right;Left    Right Shoulder Flexion  4-/5    Right Shoulder Internal Rotation  4+/5    Right Shoulder External Rotation  4/5    Right Shoulder Horizontal ABduction  4/5    Left Shoulder Flexion  5/5    Left Shoulder Internal Rotation  5/5    Left Shoulder External Rotation  5/5      Palpation   Palpation comment  concordant pain along medial border and inf angle of scapula.                 Objective measurements completed on examination: See above findings.      [place] Adult PT Treatment/Exercise - 03/17/18 0001      Manual Therapy   Manual therapy comments  skilled palpation and monitoring during TPDN       Trigger Point Dry Needling - 03/17/18 1626    Consent  Given?  Yes    Education Handout Provided  --   verbal education   Muscles Treated Upper Body  --   Right, medial border of scapula, T6 paraspinals & along rib          PT Education - 03/17/18 1625    Education Details  anatomy of condition, POC, HEP, exercise form/rationale, TPDN & expected outcomes, differential diagnosis discussed with MD    Person(s) Educated  Patient    Methods  Explanation;Verbal cues    Comprehension  Verbalized understanding;Need further instruction          PT Long Term Goals - 03/17/18 1622      PT LONG TERM GOAL #1   Title  pt will be able to return to gym/workout regimen with proper progression and knowledge    Baseline  has left exercise at this time but was working with a trainer, doing body pump and yoga    Time  6    Period  Weeks    Status  New    Target Date  04/30/18      PT LONG TERM GOAL #2   Title  Pt will be able to use her dominant arm in lifting and manipulating objects without limitation by weakness    Baseline  unable at eval    Time  6    Period  Weeks    Status  New    Target Date  04/30/18      PT LONG TERM GOAL #3   Title  pt will demo gross 5/5 UE MMt    Baseline  see flowsheet    Time  6    Period  Weeks    Status  New    Target Date  04/30/18      PT LONG TERM GOAL #4   Title  resolution of tingling into upper extremity    Baseline  frequent and unexplained    Time  6    Period  Weeks    Status  New    Target Date  04/30/18             Plan - 03/17/18 1614  Clinical Impression Statement  pt presents to PT with complaints of discomfort, rather than pain, around right scapula with distal symptoms to Right UE. Tremor noted with feeling of weakness and poor control. Grip strength WFL and stronger on Righ measured today. Gross limitation in Right GHJ strength v Left. Pt is having MRI on wednesday as well as retest for NCV. Per MD note, attempting to rule out MS and Parkinsons. DN to periscapular region  today reduced concordant distal symptoms. Will continue as appropriate to work toward functional LTGs. Pt c/o Lt shoulder pain today that has been improving- appears to be a mechanical impingement.     History and Personal Factors relevant to plan of care:  osteoporosis    Clinical Presentation  Unstable    Clinical Presentation due to:  unexplained episodes of fatigue, tremor and distal symptoms    Clinical Decision Making  Moderate    Rehab Potential  Good    PT Frequency  2x / week    PT Duration  6 weeks    PT Treatment/Interventions  ADLs/Self Care Home Management;Cryotherapy;Electrical Stimulation;Moist Heat;Functional mobility training;Therapeutic activities;Therapeutic exercise;Balance training;Manual techniques;Neuromuscular re-education;Patient/family education;Dry needling;Taping    PT Next Visit Plan  continue DN, periscapular activation in proper posture    Consulted and Agree with Plan of Care  Patient       Patient will benefit from skilled therapeutic intervention in order to improve the following deficits and impairments:  Impaired UE functional use, Increased muscle spasms, Decreased activity tolerance, Improper body mechanics, Hypermobility, Decreased balance, Decreased strength, Impaired sensation, Postural dysfunction  Visit Diagnosis: Muscle weakness (generalized) - Plan: PT plan of care cert/re-cert  Unspecified disturbances of skin sensation - Plan: PT plan of care cert/re-cert     Problem List Patient Active Problem List   Diagnosis Date Noted  . Numbness and tingling of right arm and leg 03/02/2018  . Muscle weakness (generalized) 03/02/2018  . Tremors of nervous system 03/02/2018  . Deformity of right thumb joint 03/02/2018  . Chronic pain of right thumb 03/02/2018  . Chronic neck pain 03/02/2018  . Bilateral low back pain with sciatica 03/02/2018  . Myofascial pain syndrome, cervical 03/02/2018  Jasaun Carn C. Monserrath Junio PT, DPT 03/17/18 4:30 PM   [facility]  Health Outpatient Rehabilitation [facility] [address] [place], [place], [address] Phone: [phone]   Fax:  [phone]  Name: Caitlin Braun MRN: [id number] Date of Birth: 29-Feb-1956

## 2018-03-19 ENCOUNTER — Other Ambulatory Visit (HOSPITAL_COMMUNITY): Payer: 59

## 2018-03-22 ENCOUNTER — Other Ambulatory Visit (HOSPITAL_COMMUNITY): Payer: 59

## 2018-03-24 ENCOUNTER — Ambulatory Visit (HOSPITAL_COMMUNITY): Payer: 59 | Attending: Cardiology

## 2018-03-24 ENCOUNTER — Encounter (HOSPITAL_COMMUNITY): Payer: Self-pay | Admitting: Cardiology

## 2018-03-24 ENCOUNTER — Other Ambulatory Visit: Payer: Self-pay

## 2018-03-24 ENCOUNTER — Telehealth: Payer: Self-pay

## 2018-03-24 ENCOUNTER — Ambulatory Visit: Payer: 59

## 2018-03-24 DIAGNOSIS — R011 Cardiac murmur, unspecified: Secondary | ICD-10-CM | POA: Diagnosis not present

## 2018-03-24 DIAGNOSIS — G1221 Amyotrophic lateral sclerosis: Secondary | ICD-10-CM

## 2018-03-24 DIAGNOSIS — R202 Paresthesia of skin: Secondary | ICD-10-CM

## 2018-03-24 DIAGNOSIS — R251 Tremor, unspecified: Secondary | ICD-10-CM

## 2018-03-24 DIAGNOSIS — M6281 Muscle weakness (generalized): Secondary | ICD-10-CM | POA: Diagnosis not present

## 2018-03-24 DIAGNOSIS — R5382 Chronic fatigue, unspecified: Secondary | ICD-10-CM

## 2018-03-24 DIAGNOSIS — R531 Weakness: Secondary | ICD-10-CM

## 2018-03-24 DIAGNOSIS — R200 Anesthesia of skin: Secondary | ICD-10-CM

## 2018-03-24 DIAGNOSIS — Q8740 Marfan's syndrome, unspecified: Secondary | ICD-10-CM | POA: Insufficient documentation

## 2018-03-24 DIAGNOSIS — G1229 Other motor neuron disease: Secondary | ICD-10-CM

## 2018-03-24 DIAGNOSIS — M542 Cervicalgia: Secondary | ICD-10-CM

## 2018-03-24 DIAGNOSIS — R270 Ataxia, unspecified: Secondary | ICD-10-CM

## 2018-03-24 DIAGNOSIS — G20 Parkinson's disease: Secondary | ICD-10-CM

## 2018-03-24 DIAGNOSIS — G8929 Other chronic pain: Secondary | ICD-10-CM

## 2018-03-24 MED ORDER — PERFLUTREN LIPID MICROSPHERE: 1.0000 mL | INTRAVENOUS | Status: AC | PRN

## 2018-03-24 MED ORDER — GADOBENATE DIMEGLUMINE 529 MG/ML IV SOLN
10.0000 mL | Freq: Once | INTRAVENOUS | Status: AC | PRN
  Administered 2018-03-24: 10 mL via INTRAVENOUS

## 2018-03-24 NOTE — Telephone Encounter (Signed)
Caitlin Braun was in for her echocardiogram today. Definity was administered by the performing echo technician and the patient complained of back pain. She had no other symptoms.  Another technician was notified of the reaction and Triage was called. Nursing and DOD (Dr. Caryl Comes) went to evaluate the patient. When Dr. Caryl Comes arrived, the patient's pain had almost completely resolved. He spoke with her for a few minutes and by the time he left, her pain was completed alleviated.  Her ultrasound was to be completed and she was instructed to leave once complete as long as she is still asymptomatic.

## 2018-03-24 NOTE — Progress Notes (Unsigned)
Patient had an allergic reaction to Definity. She presented with lower back pain within 1 minute after Definity was administered. The DOD, Dr. Caryl Comes came to the echo exam room and assessed the patient. The patient's back pain subsided.  Per Dr. Olin Pia order patient was discharged.

## 2018-03-29 ENCOUNTER — Telehealth: Payer: Self-pay | Admitting: Neurology

## 2018-03-29 ENCOUNTER — Encounter: Payer: Self-pay | Admitting: *Deleted

## 2018-03-29 NOTE — Telephone Encounter (Signed)
Pt sent the results through mychart. I have messaged pt also.

## 2018-03-29 NOTE — Telephone Encounter (Signed)
Pt has called stating she had her MRI done Wed. Of last week and would like results.  Pt made aware that Dr Jaynee Eagles is out of the office for the next 2 weeks.  Pt asking if the results can be reviewed by another Neurologist and that she be given a call with the results.  Pt is also asking for a copy of the test results when available.  Please call

## 2018-03-29 NOTE — Telephone Encounter (Signed)
Spoke with Dr. Felecia Shelling. Please see the comments from him regarding pt's focus on the MRI brain and the echocardiogram results.

## 2018-04-02 ENCOUNTER — Encounter

## 2018-04-05 ENCOUNTER — Ambulatory Visit: Payer: 59 | Admitting: Physical Therapy

## 2018-04-05 ENCOUNTER — Encounter: Payer: Self-pay | Admitting: Physical Therapy

## 2018-04-05 DIAGNOSIS — M6281 Muscle weakness (generalized): Secondary | ICD-10-CM | POA: Diagnosis not present

## 2018-04-05 DIAGNOSIS — R209 Unspecified disturbances of skin sensation: Secondary | ICD-10-CM

## 2018-04-05 NOTE — Therapy (Signed)
[place], [place], [address] Phone: [phone]   Fax:  [phone]  Physical Therapy Treatment  Patient Details  Name: Caitlin Braun MRN: [id number] Date of Birth: 1955-05-31 Referring Provider (PT): Sarina Ill, MD   Encounter Date: 04/05/2018  PT End of Session - 04/05/18 4585    Visit Number  2    Number of Visits  13    Date for PT Re-Evaluation  04/30/18    Authorization Type  UHC    PT Start Time  1620    PT Stop Time  1704    PT Time Calculation (min)  44 min    Activity Tolerance  Patient tolerated treatment well    Behavior During Therapy  [facility] for tasks assessed/performed       Past Medical History:  Diagnosis Date  . Osteoporosis     Past Surgical History:  Procedure Laterality Date  . ABDOMINAL HYSTERECTOMY    . AUGMENTATION MAMMAPLASTY Bilateral 2016, 1998   Patient had them redone in 2016  . COSMETIC SURGERY    . face lift    . NOSE SURGERY     cosmetic    There were no vitals filed for this visit.  Subjective Assessment - 04/05/18 1622    Subjective  2 days ago had a really bad day. Unstable handwriting and makeup application. Rt SIJ and into RLE pain with tingling, I feel like I am shuffling. Last weekend I hiked 5 miles. It hurts more when sedentary. I did something to my left shoulder since last visit and cannot raise my arm over 90 and arm gets stuck- insiidous onset.         [facility] PT Assessment - 04/05/18 0001      Palpation   Palpation comment  RLE 96.5 cm, LLE 96 cm                   [facility] Adult PT Treatment/Exercise - 04/05/18 0001      Exercises   Exercises  Knee/Hip      Knee/Hip Exercises: Supine   Bridges with Clamshell  15 reps   in DF- focus on level hips   Other Supine Knee/Hip Exercises  bent leg lift to TT with pelvic tilt    Other Supine Knee/Hip Exercises  crunches, hundreds      Manual Therapy   Manual Therapy  Muscle Energy  Technique;Soft tissue mobilization    Manual therapy comments  2 layer lift in left shoe    Soft tissue mobilization  Lt upper trap TPR    Muscle Energy Technique  Rt flexors/Lt extensors             PT Education - 04/05/18 1708    Education Details  heel lift, exercise form/rationale    Person(s) Educated  Patient    Methods  Explanation;Demonstration;Tactile cues;Verbal cues;Handout    Comprehension  Verbalized understanding;Need further instruction;Returned demonstration;Verbal cues required;Tactile cues required          PT Long Term Goals - 03/17/18 1622      PT LONG TERM GOAL #1   Title  pt will be able to return to gym/workout regimen with proper progression and knowledge    Baseline  has left exercise at this time but was working with a trainer, doing body pump and yoga    Time  6    Period  Weeks    Status  New    Target Date  04/30/18      PT LONG TERM GOAL #2   Title  Pt will be able to use her dominant arm in lifting and manipulating objects without limitation by weakness    Baseline  unable at eval    Time  6    Period  Weeks    Status  New    Target Date  04/30/18      PT LONG TERM GOAL #3   Title  pt will demo gross 5/5 UE MMt    Baseline  see flowsheet    Time  6    Period  Weeks    Status  New    Target Date  04/30/18      PT LONG TERM GOAL #4   Title  resolution of tingling into upper extremity    Baseline  frequent and unexplained    Time  6    Period  Weeks    Status  New    Target Date  04/30/18            Plan - 04/05/18 1651    Clinical Impression Statement  Notable pelvic rotation corrected with MET and measured LLD following. added 2 layer lift to left shoe. + trendelenburg in Rt stance phase. will evaluate heel lift at next visit and begin reformer if appropriate    PT Treatment/Interventions  ADLs/Self Care Home Management;Cryotherapy;Electrical Stimulation;Moist Heat;Functional mobility training;Therapeutic  activities;Therapeutic exercise;Balance training;Manual techniques;Neuromuscular re-education;Patient/family education;Dry needling;Taping    PT Next Visit Plan  DN PRN, periscap activation, core control    Consulted and Agree with Plan of Care  Patient       Patient will benefit from skilled therapeutic intervention in order to improve the following deficits and impairments:  Impaired UE functional use, Increased muscle spasms, Decreased activity tolerance, Improper body mechanics, Hypermobility, Decreased balance, Decreased strength, Impaired sensation, Postural dysfunction  Visit Diagnosis: Muscle weakness (generalized)  Unspecified disturbances of skin sensation     Problem List Patient Active Problem List   Diagnosis Date Noted  . Numbness and tingling of right arm and leg 03/02/2018  . Muscle weakness (generalized) 03/02/2018  . Tremors of nervous system 03/02/2018  . Deformity of right thumb joint 03/02/2018  . Chronic pain of right thumb 03/02/2018  . Chronic neck pain 03/02/2018  . Bilateral low back pain with sciatica 03/02/2018  . Myofascial pain syndrome, cervical 03/02/2018    Monee Dembeck C. Armonte Tortorella PT, DPT 04/05/18 5:09 PM   [place] [facility] [address] [place], [place], [address] Phone: [phone]   Fax:  [phone]  Name: ABYGAYLE DELTORO MRN: [id number] Date of Birth: Aug 28, 1955

## 2018-04-06 MED ORDER — PERFLUTREN LIPID MICROSPHERE
1.0000 mL | INTRAVENOUS | Status: AC | PRN
  Administered 2018-03-24: 2 mL via INTRAVENOUS

## 2018-04-09 ENCOUNTER — Encounter: Payer: Self-pay | Admitting: Physical Therapy

## 2018-04-09 ENCOUNTER — Ambulatory Visit: Payer: 59 | Attending: Neurology | Admitting: Physical Therapy

## 2018-04-09 ENCOUNTER — Other Ambulatory Visit: Payer: Self-pay | Admitting: Neurology

## 2018-04-09 DIAGNOSIS — M6281 Muscle weakness (generalized): Secondary | ICD-10-CM | POA: Diagnosis not present

## 2018-04-09 DIAGNOSIS — R209 Unspecified disturbances of skin sensation: Secondary | ICD-10-CM | POA: Insufficient documentation

## 2018-04-09 NOTE — Therapy (Signed)
[place], [place], [address] Phone: [phone]   Fax:  [phone]  Physical Therapy Treatment  Patient Details  Name: Caitlin Braun MRN: [id number] Date of Birth: 17-Feb-1956 Referring Provider (PT): Sarina Ill, MD   Encounter Date: 04/09/2018  PT End of Session - 04/09/18 0850    Visit Number  3    Number of Visits  13    Date for PT Re-Evaluation  04/30/18    Authorization Type  UHC    PT Start Time  0846    PT Stop Time  0925    PT Time Calculation (min)  39 min    Activity Tolerance  Patient tolerated treatment well    Behavior During Therapy  [facility] for tasks assessed/performed       Past Medical History:  Diagnosis Date  . Osteoporosis     Past Surgical History:  Procedure Laterality Date  . ABDOMINAL HYSTERECTOMY    . AUGMENTATION MAMMAPLASTY Bilateral 2016, 1998   Patient had them redone in 2016  . COSMETIC SURGERY    . face lift    . NOSE SURGERY     cosmetic    There were no vitals filed for this visit.  Subjective Assessment - 04/09/18 0849    Subjective  I was sore after the exercises. I took the lift out because I felt more "off-kilter"    Currently in Pain?  No/denies                       [facility] Adult PT Treatment/Exercise - 04/09/18 0001      Pilates   Pilates Reformer  see PT note      Knee/Hip Exercises: Aerobic   Nustep  5 min L6 UE&LE          Pilates Reformer used for LE/core strength, postural strength, lumbopelvic disassociation and core control.  Exercises included: Footwork- 2 red 1 blue neutral, in PF, heel raises, turnout on heels, marching to TT Bridging- 2 red 1 blue ball bw knees Supine Arm work- 1 red 1 blue press down legs TT, circles, triceps press Feet in Straps- 1 red 1 blue TT press in turnout, straight leg press in turnout  Hamstring stretch 30s each Figure 4 stretch        PT Long Term Goals - 03/17/18 1622      PT LONG TERM GOAL #1   Title  pt will be able to return to gym/workout regimen with proper progression and knowledge    Baseline  has left exercise at this time but was working with a trainer, doing body pump and yoga    Time  6    Period  Weeks    Status  New    Target Date  04/30/18      PT LONG TERM GOAL #2   Title  Pt will be able to use her dominant arm in lifting and manipulating objects without limitation by weakness    Baseline  unable at eval    Time  6    Period  Weeks    Status  New    Target Date  04/30/18      PT LONG TERM GOAL #3   Title  pt will demo gross 5/5 UE MMt    Baseline  see flowsheet    Time  6    Period  Weeks    Status  New    Target Date  04/30/18      PT LONG TERM GOAL #4   Title  resolution of tingling into upper extremity    Baseline  frequent and unexplained    Time  6    Period  Weeks    Status  New    Target Date  04/30/18            Plan - 04/09/18 4665    Clinical Impression Statement  Utilized reformer today to challenge strengthening with coordination. Good form with frequent cuing required. pt enjoyed this exercise technique    PT Treatment/Interventions  ADLs/Self Care Home Management;Cryotherapy;Electrical Stimulation;Moist Heat;Functional mobility training;Therapeutic activities;Therapeutic exercise;Balance training;Manual techniques;Neuromuscular re-education;Patient/family education;Dry needling;Taping    PT Next Visit Plan  DN PRN, periscap activation, core control    PT Home Exercise Plan  cont reformer    Consulted and Agree with Plan of Care  Patient       Patient will benefit from skilled therapeutic intervention in order to improve the following deficits and impairments:  Impaired UE functional use, Increased muscle spasms, Decreased activity tolerance, Improper body mechanics, Hypermobility, Decreased balance, Decreased strength, Impaired sensation, Postural dysfunction  Visit Diagnosis: Muscle weakness  (generalized)  Unspecified disturbances of skin sensation     Problem List Patient Active Problem List   Diagnosis Date Noted  . Numbness and tingling of right arm and leg 03/02/2018  . Muscle weakness (generalized) 03/02/2018  . Tremors of nervous system 03/02/2018  . Deformity of right thumb joint 03/02/2018  . Chronic pain of right thumb 03/02/2018  . Chronic neck pain 03/02/2018  . Bilateral low back pain with sciatica 03/02/2018  . Myofascial pain syndrome, cervical 03/02/2018   Sawyer Kahan C. Charisma Charlot PT, DPT 04/09/18 9:26 AM   [facility] [address] [place], [place], [address] Phone: [phone]   Fax:  [phone]  Name: Caitlin Braun MRN: [id number] Date of Birth: Aug 08, 1955

## 2018-04-12 ENCOUNTER — Encounter: Payer: Self-pay | Admitting: Physical Therapy

## 2018-04-12 ENCOUNTER — Ambulatory Visit: Payer: 59 | Admitting: Physical Therapy

## 2018-04-12 DIAGNOSIS — R209 Unspecified disturbances of skin sensation: Secondary | ICD-10-CM

## 2018-04-12 DIAGNOSIS — M6281 Muscle weakness (generalized): Secondary | ICD-10-CM

## 2018-04-12 NOTE — Therapy (Signed)
[place], [place], [address] Phone: [phone]   Fax:  [phone]  Physical Therapy Treatment  Patient Details  Name: Caitlin Braun MRN: [id number] Date of Birth: 1955/05/28 Referring Provider (PT): Sarina Ill, MD   Encounter Date: 04/12/2018  PT End of Session - 04/12/18 1550    Visit Number  4    Number of Visits  13    Date for PT Re-Evaluation  04/30/18    Authorization Type  [employer]    PT Start Time  1550   pt arrived late   PT Stop Time  1630    PT Time Calculation (min)  40 min    Activity Tolerance  Patient tolerated treatment well    Behavior During Therapy  [facility] for tasks assessed/performed       Past Medical History:  Diagnosis Date  . Osteoporosis     Past Surgical History:  Procedure Laterality Date  . ABDOMINAL HYSTERECTOMY    . AUGMENTATION MAMMAPLASTY Bilateral 2016, 1998   Patient had them redone in 2016  . COSMETIC SURGERY    . face lift    . NOSE SURGERY     cosmetic    There were no vitals filed for this visit.  Subjective Assessment - 04/12/18 1551    Subjective  I felt great after last appointment    Currently in Pain?  No/denies                       [facility] Adult PT Treatment/Exercise - 04/12/18 0001      Pilates   Pilates Reformer  see PT note         Pilates Reformer used for LE/core strength, postural strength, lumbopelvic disassociation and core control.  Exercises included: Footwork- 2 red 1 blue bil press, t/o on heels, single press in t/o opp leg lift/lower, neutral press on toes opp ext/90 Bridging 2 red 1 blue ball bw knees feet // Short box qped press 1 red, qped press with opp arm reach 1 red Sidelying press from platform 1 red 1 blue, sidelying heel raises 1 red 1 blue  Hamstring stretch with strap figure 4 stretch       PT Long Term Goals - 03/17/18 1622      PT LONG TERM GOAL #1   Title  pt will be able to return to  gym/workout regimen with proper progression and knowledge    Baseline  has left exercise at this time but was working with a trainer, doing body pump and yoga    Time  6    Period  Weeks    Status  New    Target Date  04/30/18      PT LONG TERM GOAL #2   Title  Pt will be able to use her dominant arm in lifting and manipulating objects without limitation by weakness    Baseline  unable at eval    Time  6    Period  Weeks    Status  New    Target Date  04/30/18      PT LONG TERM GOAL #3   Title  pt will demo gross 5/5 UE MMt    Baseline  see flowsheet    Time  6    Period  Weeks    Status  New    Target Date  04/30/18      PT LONG TERM GOAL #4   Title  resolution of tingling into upper extremity    Baseline  frequent and unexplained    Time  6    Period  Weeks    Status  New    Target Date  04/30/18            Plan - 04/12/18 1624    Clinical Impression Statement  Frequent cues for form and speed control on reformer. poor endurance in quadruped position and tingling noted to increase with collapse of posture. No notable tremor with reach.     PT Treatment/Interventions  ADLs/Self Care Home Management;Cryotherapy;Electrical Stimulation;Moist Heat;Functional mobility training;Therapeutic activities;Therapeutic exercise;Balance training;Manual techniques;Neuromuscular re-education;Patient/family education;Dry needling;Taping    PT Next Visit Plan  continue reformer work    Oncologist with Plan of Care  Patient       Patient will benefit from skilled therapeutic intervention in order to improve the following deficits and impairments:  Impaired UE functional use, Increased muscle spasms, Decreased activity tolerance, Improper body mechanics, Hypermobility, Decreased balance, Decreased strength, Impaired sensation, Postural dysfunction  Visit Diagnosis: Muscle weakness (generalized)  Unspecified disturbances of skin sensation     Problem List Patient Active  Problem List   Diagnosis Date Noted  . Numbness and tingling of right arm and leg 03/02/2018  . Muscle weakness (generalized) 03/02/2018  . Tremors of nervous system 03/02/2018  . Deformity of right thumb joint 03/02/2018  . Chronic pain of right thumb 03/02/2018  . Chronic neck pain 03/02/2018  . Bilateral low back pain with sciatica 03/02/2018  . Myofascial pain syndrome, cervical 03/02/2018   Khushbu Pippen C. Kazumi Lachney PT, DPT 04/12/18 4:31 PM   [facility] [address] [place], [place], [address] Phone: [phone]   Fax:  [phone]  Name: Caitlin Braun MRN: [id number] Date of Birth: 08-05-1955

## 2018-04-14 ENCOUNTER — Other Ambulatory Visit: Payer: Self-pay | Admitting: Neurology

## 2018-04-14 ENCOUNTER — Ambulatory Visit: Payer: 59 | Admitting: Physical Therapy

## 2018-04-14 ENCOUNTER — Encounter: Payer: Self-pay | Admitting: Physical Therapy

## 2018-04-14 DIAGNOSIS — R209 Unspecified disturbances of skin sensation: Secondary | ICD-10-CM

## 2018-04-14 DIAGNOSIS — M6281 Muscle weakness (generalized): Secondary | ICD-10-CM

## 2018-04-14 DIAGNOSIS — G20 Parkinson's disease: Secondary | ICD-10-CM

## 2018-04-14 NOTE — Patient Instructions (Signed)
Step 1  Step 2  Seated Scapular Retraction reps: 10  sets: 3  hold: 5  daily: 1  weekly: 7 Setup  Begin sitting in an upright position. Movement  Gently squeeze your shoulder blades together, relax, and then repeat. Tip  Make sure to maintain good posture during the exercise. Step 1  Step 2  Step 3  Doorway Pec Stretch at 90 Degrees Abduction reps: 3  sets: 1  hold: 30  daily: 1  weekly: 7 Setup  Begin in a standing upright position in the center of a doorway. Movement  With your elbows bent, place your forearms on the sides of the doorway at a 90 degree angle from your sides, then take a small step forward until your feel a stretch in the front of your shoulders. Hold this position.  Tip  Make sure to maintain a gentle stretch and do not shrug your shoulders during the exercise.

## 2018-04-14 NOTE — Therapy (Signed)
[place], [place], [address] Phone: [phone]   Fax:  [phone]  Physical Therapy Treatment  Patient Details  Name: Caitlin Braun MRN: [id number] Date of Birth: 28-Aug-1955 Referring Provider (PT): Sarina Ill, MD   Encounter Date: 04/14/2018  PT End of Session - 04/14/18 1446    Visit Number  5    Number of Visits  13    Date for PT Re-Evaluation  04/30/18    Authorization Type  UHC    PT Start Time  6967    PT Stop Time  1520    PT Time Calculation (min)  46 min    Activity Tolerance  Patient tolerated treatment well    Behavior During Therapy  [facility] for tasks assessed/performed       Past Medical History:  Diagnosis Date  . Osteoporosis     Past Surgical History:  Procedure Laterality Date  . ABDOMINAL HYSTERECTOMY    . AUGMENTATION MAMMAPLASTY Bilateral 2016, 1998   Patient had them redone in 2016  . COSMETIC SURGERY    . face lift    . NOSE SURGERY     cosmetic    There were no vitals filed for this visit.  Subjective Assessment - 04/14/18 1438    Subjective  My shoulder kept me up all night.      Currently in Pain?  Yes    Pain Location  Shoulder    Pain Orientation  Left    Pain Descriptors / Indicators  Other (Comment)   gnawing    Pain Onset  Yesterday    Pain Frequency  Intermittent        [facility] Adult PT Treatment/Exercise - 04/14/18 0001      Pilates   Pilates Reformer  see note         Pilates Reformer used for LE/core strength, postural strength, lumbopelvic disassociation and core control.  Exercises included:  Footwork 2 Red 1 blue parallel heels, toes, wide in turnout with double leg, then single leg same springs.  Cues for breathing, rhythm and getting full extension through LEs.   Bridging  4 springs with ball between knees , good articulation  Feet in Straps 1 Red 1 yellow Arcs and squats   Mod cueing for avoiding posterior tilt with hip flexion (arc  up)   Again, cues to extension knees and adduct hips end of range of squat  Long box Prone  1 blue pulling strap shoulder extension , min cues for full ROM    Mermaid 1 red x 5 , unable to raise Rt arm overhead for full expression , modified  Press out arms x 5 and rotation stretching, spinal extension      PT Education - 04/15/18 0559    Education Details  HEP, Pilates principles, neutral spine    Person(s) Educated  Patient    Methods  Explanation;Tactile cues;Verbal cues;Handout    Comprehension  Verbalized understanding;Returned demonstration;Need further instruction          PT Long Term Goals - 03/17/18 1622      PT LONG TERM GOAL #1   Title  pt will be able to return to gym/workout regimen with proper progression and knowledge    Baseline  has left exercise at this time but was working with a trainer, doing body pump and yoga    Time  6    Period  Weeks    Status  New    Target  Date  04/30/18      PT LONG TERM GOAL #2   Title  Pt will be able to use her dominant arm in lifting and manipulating objects without limitation by weakness    Baseline  unable at eval    Time  6    Period  Weeks    Status  New    Target Date  04/30/18      PT LONG TERM GOAL #3   Title  pt will demo gross 5/5 UE MMt    Baseline  see flowsheet    Time  6    Period  Weeks    Status  New    Target Date  04/30/18      PT LONG TERM GOAL #4   Title  resolution of tingling into upper extremity    Baseline  frequent and unexplained    Time  6    Period  Weeks    Status  New    Target Date  04/30/18            Plan - 04/15/18 0601    Clinical Impression Statement  Patient had difficulty maintaining neutral pelvis during supine Reformer exercises.  Focused on lower body today as she was having Rt sided UE pain since last session.  She has good flexibility in her hips but lacks control and coordination. Continues to benefit from strength and proprioceptive training offered by  Pilates equipment.      PT Treatment/Interventions  ADLs/Self Care Home Management;Cryotherapy;Electrical Stimulation;Moist Heat;Functional mobility training;Therapeutic activities;Therapeutic exercise;Balance training;Manual techniques;Neuromuscular re-education;Patient/family education;Dry needling;Taping    PT Next Visit Plan  continue reformer work. Try overhead in prone for extension, seated arms, quadruped     PT Home Exercise Plan  cont reformer    Consulted and Agree with Plan of Care  Patient       Patient will benefit from skilled therapeutic intervention in order to improve the following deficits and impairments:     Visit Diagnosis: Muscle weakness (generalized)  Unspecified disturbances of skin sensation     Problem List Patient Active Problem List   Diagnosis Date Noted  . Numbness and tingling of right arm and leg 03/02/2018  . Muscle weakness (generalized) 03/02/2018  . Tremors of nervous system 03/02/2018  . Deformity of right thumb joint 03/02/2018  . Chronic pain of right thumb 03/02/2018  . Chronic neck pain 03/02/2018  . Bilateral low back pain with sciatica 03/02/2018  . Myofascial pain syndrome, cervical 03/02/2018    Sicily Zaragoza 04/15/2018, 6:06 AM  [place], [place], [address] Phone: [phone]   Fax:  [phone]  Name: Caitlin Braun MRN: [id number] Date of Birth: 15-Nov-1955    Raeford Razor, PT 04/15/18 6:10 AM Phone: [phone] Fax: [phone]

## 2018-04-19 ENCOUNTER — Encounter: Payer: 59 | Admitting: Physical Therapy

## 2018-04-21 ENCOUNTER — Ambulatory Visit: Payer: 59 | Admitting: Physical Therapy

## 2018-04-21 ENCOUNTER — Encounter: Payer: Self-pay | Admitting: Physical Therapy

## 2018-04-21 DIAGNOSIS — M6281 Muscle weakness (generalized): Secondary | ICD-10-CM

## 2018-04-21 DIAGNOSIS — R209 Unspecified disturbances of skin sensation: Secondary | ICD-10-CM

## 2018-04-21 NOTE — Therapy (Signed)
[place], [place], [address] Phone: [phone]   Fax:  [phone]  Physical Therapy Treatment  Patient Details  Name: Caitlin Braun MRN: [id number] Date of Birth: 02/09/1956 Referring Provider (PT): Sarina Ill, MD   Encounter Date: 04/21/2018  PT End of Session - 04/21/18 1528    Visit Number  6    Number of Visits  13    Date for PT Re-Evaluation  04/30/18    Authorization Type  UHC    PT Start Time  4163    PT Stop Time  1525    PT Time Calculation (min)  65 min    Activity Tolerance  Patient tolerated treatment well    Behavior During Therapy  [facility] for tasks assessed/performed       Past Medical History:  Diagnosis Date  . Osteoporosis     Past Surgical History:  Procedure Laterality Date  . ABDOMINAL HYSTERECTOMY    . AUGMENTATION MAMMAPLASTY Bilateral 2016, 1998   Patient had them redone in 2016  . COSMETIC SURGERY    . face lift    . NOSE SURGERY     cosmetic    There were no vitals filed for this visit.  Subjective Assessment - 04/21/18 1437    Subjective  Decided to try and move a piece of furniture.  My back has been hurting ever since.  Was a 73/10 yesterday. My arm is having a sensation down to my hand.  Rt leg quivering from within , usually only in sitting.           [facility] Adult PT Treatment/Exercise - 04/21/18 0001      Self-Care   Self-Care  Lifting;Posture;Heat/Ice Application;Other Self-Care Comments    Lifting  [employer] [employer] [facility] handout    Posture  seated, spinal extension, neutral     Heat/Ice Application  heat post session     Other Self-Care Comments   HEP, core       Lumbar Exercises: Supine   Ab Set  5 reps    Pelvic Tilt  5 reps    Pelvic Tilt Limitations  to find neutral    Clam  10 reps    Clam Limitations  bilateral and unilateral blue band     Bridge  10 reps    Bridge with clamshell  10 reps      Lumbar Exercises: Sidelying   Clam  Both;20  reps    Clam Limitations  blue band       Lumbar Exercises: Quadruped   [place]cat/Old Horse  5 reps    Opposite Arm/Leg Raise  Right arm/Left leg;Left arm/Right leg;10 reps    Other Quadruped Lumbar Exercises  hip hinge x 5     Other Quadruped Lumbar Exercises  lumbar spine "popping, crackling" with LLE extended , no movement    no pain      Manual Therapy   Manual Therapy  Passive ROM;Manual Traction    Soft tissue mobilization  Rt >Lt upper traps, levator scap, posterior cervicals bilaterally     Passive ROM  sidebending x 2 , 30 sec     Manual Traction  gentle x 5         PT Education - 04/21/18 1527    Education Details  bone density, considerations for exercising with osteopenia    Person(s) Educated  Patient    Methods  Explanation;Verbal cues;Handout;Tactile cues    Comprehension  Verbalized understanding;Returned demonstration  PT Long Term Goals - 04/21/18 1531      PT LONG TERM GOAL #1   Title  pt will be able to return to gym/workout regimen with proper progression and knowledge    Status  On-going      PT LONG TERM GOAL #2   Title  Pt will be able to use her dominant arm in lifting and manipulating objects without limitation by weakness    Status  On-going      PT LONG TERM GOAL #3   Title  pt will demo gross 5/5 UE MMt    Status  On-going      PT LONG TERM GOAL #4   Title  resolution of tingling into upper extremity    Baseline  improved with manual PT today    Status  On-going            Plan - 04/21/18 1538    Clinical Impression Statement  Patient flared up with lifting episode, no pain during exercises though.  Addressed some questions about osteoporosis and back pain.  She verbalizes "knowing she has a pinched nerve or disc problem" in her back and thats what is causing her issues.  Reduced Rt arm/hand symptoms today with softi tissue work and stretching to cervical spine. Good technique and control with exercises.      PT  Treatment/Interventions  ADLs/Self Care Home Management;Cryotherapy;Electrical Stimulation;Moist Heat;Functional mobility training;Therapeutic activities;Therapeutic exercise;Balance training;Manual techniques;Neuromuscular re-education;Patient/family education;Dry needling;Taping    PT Next Visit Plan  continue reformer work. Try overhead in prone for extension, seated arms, quadruped     PT Home Exercise Plan  scapular protraction, clam supine with bridge, SL clam and bird dog     Consulted and Agree with Plan of Care  Patient       Patient will benefit from skilled therapeutic intervention in order to improve the following deficits and impairments:  Impaired UE functional use, Increased muscle spasms, Decreased activity tolerance, Improper body mechanics, Hypermobility, Decreased balance, Decreased strength, Impaired sensation, Postural dysfunction  Visit Diagnosis: Muscle weakness (generalized)  Unspecified disturbances of skin sensation     Problem List Patient Active Problem List   Diagnosis Date Noted  . Numbness and tingling of right arm and leg 03/02/2018  . Muscle weakness (generalized) 03/02/2018  . Tremors of nervous system 03/02/2018  . Deformity of right thumb joint 03/02/2018  . Chronic pain of right thumb 03/02/2018  . Chronic neck pain 03/02/2018  . Bilateral low back pain with sciatica 03/02/2018  . Myofascial pain syndrome, cervical 03/02/2018    , 04/21/2018, 3:51 PM  [facility] [address] [place], [place], [address] Phone: [phone]   Fax:  [phone]  Name: Caitlin Braun MRN: [id number] Date of Birth: Jul 21, 1955  Raeford Razor, PT 04/21/18 3:51 PM Phone: [phone] Fax: [phone]

## 2018-04-21 NOTE — Patient Instructions (Signed)
Bird Dog Pose - Intermediate    Keep pelvis stable. From table pose, step one leg back to plank pose. Reach opposite arm forward, back foot off floor.  Hold for __1__ breaths. Return arm and leg at same rate. Repeat __5-10__ times, alternating sides.  Copyright  VHI. All rights reserved.

## 2018-04-22 DIAGNOSIS — R300 Dysuria: Secondary | ICD-10-CM | POA: Diagnosis not present

## 2018-04-23 ENCOUNTER — Encounter: Payer: Self-pay | Admitting: Physical Therapy

## 2018-04-23 ENCOUNTER — Ambulatory Visit: Payer: 59 | Admitting: Physical Therapy

## 2018-04-23 DIAGNOSIS — M6281 Muscle weakness (generalized): Secondary | ICD-10-CM | POA: Diagnosis not present

## 2018-04-23 DIAGNOSIS — R209 Unspecified disturbances of skin sensation: Secondary | ICD-10-CM

## 2018-04-23 NOTE — Therapy (Signed)
[place], [place], [address] Phone: [phone]   Fax:  [phone]  Physical Therapy Treatment  Patient Details  Name: Caitlin Braun MRN: [id number] Date of Birth: 1955-12-01 Referring Provider (PT): Sarina Ill, MD   Encounter Date: 04/23/2018  PT End of Session - 04/23/18 1145    Visit Number  7    Number of Visits  13    Date for PT Re-Evaluation  04/30/18    Authorization Type  UHC    PT Start Time  1100    PT Stop Time  1145    PT Time Calculation (min)  45 min    Activity Tolerance  Patient tolerated treatment well    Behavior During Therapy  [facility] for tasks assessed/performed       Past Medical History:  Diagnosis Date  . Osteoporosis     Past Surgical History:  Procedure Laterality Date  . ABDOMINAL HYSTERECTOMY    . AUGMENTATION MAMMAPLASTY Bilateral 2016, 1998   Patient had them redone in 2016  . COSMETIC SURGERY    . face lift    . NOSE SURGERY     cosmetic    There were no vitals filed for this visit.  Subjective Assessment - 04/23/18 1103    Subjective  Feeling sore but arm is better today. Resolution of tingling in hand. I think it is bad the days I am in the office and am on the computer all day.     Currently in Pain?  Yes    Pain Score  4     Pain Location  Back    Pain Descriptors / Indicators  Sore    Aggravating Factors   moving furniture                       [facility] Adult PT Treatment/Exercise - 04/23/18 0001      Pilates   Pilates Reformer  see PT note         Pilates Reformer used for LE/core strength, postural strength, lumbopelvic disassociation and core control.  Exercises included: Footwork 2 red 1 blue neutral/ER/on toes, single leg march Bridging 2 red 1 blue in DF Long box Prone 1R1B bil OH press, 1 blue single arm press Plank press out on knees 1 red 1 blue High kneeling straps ext & triceps pull Sidelying feet in straps 1 blut  press, hip flx/ext, circles with knee ext figure 4 stretch sidelying stretch foot in strap    PT Long Term Goals - 04/21/18 1531      PT LONG TERM GOAL #1   Title  pt will be able to return to gym/workout regimen with proper progression and knowledge    Status  On-going      PT LONG TERM GOAL #2   Title  Pt will be able to use her dominant arm in lifting and manipulating objects without limitation by weakness    Status  On-going      PT LONG TERM GOAL #3   Title  pt will demo gross 5/5 UE MMt    Status  On-going      PT LONG TERM GOAL #4   Title  resolution of tingling into upper extremity    Baseline  improved with manual PT today    Status  On-going            Plan - 04/23/18 1149    Clinical Impression Statement  Significant  weakness noted in right hip abductors vs Left. Cues to elevate Right elbow and drop Lt shoulder in prone OH.    PT Treatment/Interventions  ADLs/Self Care Home Management;Cryotherapy;Electrical Stimulation;Moist Heat;Functional mobility training;Therapeutic activities;Therapeutic exercise;Balance training;Manual techniques;Neuromuscular re-education;Patient/family education;Dry needling;Taping    PT Next Visit Plan  reformer- hip abd strengthening, cont overhead    PT Home Exercise Plan  scapular protraction, clam supine with bridge, SL clam and bird dog     Consulted and Agree with Plan of Care  Patient       Patient will benefit from skilled therapeutic intervention in order to improve the following deficits and impairments:  Impaired UE functional use, Increased muscle spasms, Decreased activity tolerance, Improper body mechanics, Hypermobility, Decreased balance, Decreased strength, Impaired sensation, Postural dysfunction  Visit Diagnosis: Muscle weakness (generalized)  Unspecified disturbances of skin sensation     Problem List Patient Active Problem List   Diagnosis Date Noted  . Numbness and tingling of right arm and leg 03/02/2018   . Muscle weakness (generalized) 03/02/2018  . Tremors of nervous system 03/02/2018  . Deformity of right thumb joint 03/02/2018  . Chronic pain of right thumb 03/02/2018  . Chronic neck pain 03/02/2018  . Bilateral low back pain with sciatica 03/02/2018  . Myofascial pain syndrome, cervical 03/02/2018    Lora Glomski C. Vishnu Moeller PT, DPT 04/23/18 11:51 AM   [facility] [address] [place], [place], [address] Phone: [phone]   Fax:  [phone]  Name: Caitlin Braun MRN: [id number] Date of Birth: 1956-01-28

## 2018-04-26 ENCOUNTER — Encounter: Payer: Self-pay | Admitting: Physical Therapy

## 2018-04-26 ENCOUNTER — Ambulatory Visit: Payer: 59 | Admitting: Physical Therapy

## 2018-04-26 DIAGNOSIS — M6281 Muscle weakness (generalized): Secondary | ICD-10-CM

## 2018-04-26 DIAGNOSIS — R209 Unspecified disturbances of skin sensation: Secondary | ICD-10-CM

## 2018-04-26 NOTE — Therapy (Signed)
[place], [place], [address] Phone: [phone]   Fax:  [phone]  Physical Therapy Treatment  Patient Details  Name: Caitlin Braun MRN: [id number] Date of Birth: Jul 01, 1955 Referring Provider (PT): Sarina Ill, MD   Encounter Date: 04/26/2018  PT End of Session - 04/26/18 1553    Visit Number  8    Number of Visits  13    Date for PT Re-Evaluation  04/30/18    PT Start Time  3790    PT Stop Time  1632    PT Time Calculation (min)  41 min    Activity Tolerance  Patient tolerated treatment well    Behavior During Therapy  [facility] for tasks assessed/performed       Past Medical History:  Diagnosis Date  . Osteoporosis     Past Surgical History:  Procedure Laterality Date  . ABDOMINAL HYSTERECTOMY    . AUGMENTATION MAMMAPLASTY Bilateral 2016, 1998   Patient had them redone in 2016  . COSMETIC SURGERY    . face lift    . NOSE SURGERY     cosmetic    There were no vitals filed for this visit.  Subjective Assessment - 04/26/18 1552    Subjective  I struggled last week but I felt so good the next day. Bent funny at my Lt shoulder yesterday but it's okay.     Patient Stated Goals  build strength    Currently in Pain?  No/denies                       [facility] Adult PT Treatment/Exercise - 04/26/18 0001      Pilates   Pilates Reformer  see PT note         Pilates Reformer used for LE/core strength, postural strength, lumbopelvic disassociation and core control.  Exercises included: Footwork- 2 red: neutral, turnout, single leg with SLR Supine Arm work 1red 1 blue: straight arm press, circles, triceps Feet in Straps sidelying 1 blue 1 yellow: press neutral & ER; abd/add with leg extended neutral High kneeling hinge 1 blue+UE ext; bil GHJ ext rot; facing bar: 1 blue 1 yellow chest press, biceps curls Long box prone OH press double & single UE 1 blue- narrow &  wide          PT Long Term Goals - 04/21/18 1531      PT LONG TERM GOAL #1   Title  pt will be able to return to gym/workout regimen with proper progression and knowledge    Status  On-going      PT LONG TERM GOAL #2   Title  Pt will be able to use her dominant arm in lifting and manipulating objects without limitation by weakness    Status  On-going      PT LONG TERM GOAL #3   Title  pt will demo gross 5/5 UE MMt    Status  On-going      PT LONG TERM GOAL #4   Title  resolution of tingling into upper extremity    Baseline  improved with manual PT today    Status  On-going            Plan - 04/26/18 1632    Clinical Impression Statement  Continued to challenge multiple muscle groups on reformer. Significant difficulty with end range control in both UE & LE as well as slowing motions to control multiple muscle groups. Reported  fatigue end of session as expected. Pt will be d/c at next visit and she would like to join a reformer class in the community for continued exercise. Reported some numbness in Rt Ue & LE today but she was sitting at her computer working all day.     PT Treatment/Interventions  ADLs/Self Care Home Management;Cryotherapy;Electrical Stimulation;Moist Heat;Functional mobility training;Therapeutic activities;Therapeutic exercise;Balance training;Manual techniques;Neuromuscular re-education;Patient/family education;Dry needling;Taping    PT Next Visit Plan  review any reformer exercises, discuss transition to community class, remind of posture, D/C    PT Home Exercise Plan  scapular protraction, clam supine with bridge, SL clam and bird dog     Consulted and Agree with Plan of Care  Patient       Patient will benefit from skilled therapeutic intervention in order to improve the following deficits and impairments:  Impaired UE functional use, Increased muscle spasms, Decreased activity tolerance, Improper body mechanics, Hypermobility, Decreased balance,  Decreased strength, Impaired sensation, Postural dysfunction  Visit Diagnosis: Muscle weakness (generalized)  Unspecified disturbances of skin sensation     Problem List Patient Active Problem List   Diagnosis Date Noted  . Numbness and tingling of right arm and leg 03/02/2018  . Muscle weakness (generalized) 03/02/2018  . Tremors of nervous system 03/02/2018  . Deformity of right thumb joint 03/02/2018  . Chronic pain of right thumb 03/02/2018  . Chronic neck pain 03/02/2018  . Bilateral low back pain with sciatica 03/02/2018  . Myofascial pain syndrome, cervical 03/02/2018    Brahim Dolman C. Marlyn Rabine PT, DPT 04/26/18 4:36 PM   [facility] [address] [place], [place], [address] Phone: [phone]   Fax:  [phone]  Name: Caitlin Braun MRN: [id number] Date of Birth: 12-10-1955

## 2018-04-27 NOTE — Telephone Encounter (Signed)
[employer] Auth: for the Dat Scan [id number] (exp. 04/22/18 to 06/06/18) patient is scheduled for 06/03/18.

## 2018-04-27 NOTE — Telephone Encounter (Signed)
We found the approval. Caitlin Braun is going to put in the patients chart. I called the patient to make her aware, she stated that she has been scheduled.

## 2018-04-28 ENCOUNTER — Ambulatory Visit: Payer: 59 | Admitting: Physical Therapy

## 2018-04-28 ENCOUNTER — Encounter: Payer: Self-pay | Admitting: Physical Therapy

## 2018-04-28 DIAGNOSIS — R209 Unspecified disturbances of skin sensation: Secondary | ICD-10-CM

## 2018-04-28 DIAGNOSIS — M6281 Muscle weakness (generalized): Secondary | ICD-10-CM | POA: Diagnosis not present

## 2018-04-28 NOTE — Therapy (Signed)
[place], [place], [address] Phone: [phone]   Fax:  [phone]  Physical Therapy Treatment/Discharge  Patient Details  Name: Caitlin Braun MRN: [id number] Date of Birth: [birth year] Referring Provider (PT): Sarina Ill, MD   Encounter Date: 04/28/2018  PT End of Session - 04/28/18 1441    Visit Number  9    Number of Visits  13    Date for PT Re-Evaluation  04/30/18    Authorization Type  [employer]    PT Start Time  6761    PT Stop Time  1530    PT Time Calculation (min)  52 min    Activity Tolerance  Patient tolerated treatment well    Behavior During Therapy  [facility] for tasks assessed/performed       Past Medical History:  Diagnosis Date  . Osteoporosis     Past Surgical History:  Procedure Laterality Date  . ABDOMINAL HYSTERECTOMY    . AUGMENTATION MAMMAPLASTY Bilateral 2016, 1998   Patient had them redone in 2016  . COSMETIC SURGERY    . face lift    . NOSE SURGERY     cosmetic    There were no vitals filed for this visit.  Subjective Assessment - 04/28/18 1439    Subjective  No pain really.  Every time I go to work im tingly down my Rt arm and Rt leg.  It happens due to being at the computer, gets up 30 min to stretch     Currently in Pain?  No/denies       Pilates Reformer used for LE/core strength, postural strength, lumbopelvic disassociation and core control.  Exercises included:  Footwork 2 Red 1 blue parallel, turnout narrow and wide, cues for neutral pelvis   Supine Arm work 1 Red 1 Yellow Arcs flexion and abduction legs table top, cues for fully extend elbows  Feet in Straps 1 Red 1 yellow Arcs in parallel and in V, squats manual cues to guide plane of motion   Seated Arms/Abs roll down x 5 red and x 5 blue   Tall kneeling arms serving x 10 with close supervision for safety  Scooter 1 red x 20 each leg, unable to let go of UE assist   Hip flexor stretching       PT  Long Term Goals - 04/28/18 1441      PT LONG TERM GOAL #1   Title  pt will be able to return to gym/workout regimen with proper progression and knowledge    Baseline  has not done     Status  Not Met      PT LONG TERM GOAL #2   Title  Pt will be able to use her dominant arm in lifting and manipulating objects without limitation by weakness    Baseline  mild tremor coming in towards the body , min weakness     Status  Partially Met      PT LONG TERM GOAL #3   Title  pt will demo gross 5/5 UE MMt    Baseline  bilateral UEs flexion 4/5 , abd 4+/5      PT LONG TERM GOAL #4   Title  resolution of tingling into upper extremity    Baseline  comes and goes, is better overall     Status  Partially Met            Plan - 04/28/18 1530    Clinical Impression Statement  Patient would like to transition to community Pilates class.  She continues with UE weakness and Rt sided sensory disturbance, tremor.  Her test for PD is in 3 weeks.  She will be DC today.     PT Next Visit Plan  DC , NA     PT Home Exercise Plan  scapular protraction, clam supine with bridge, SL clam and bird dog     Consulted and Agree with Plan of Care  Patient       Patient will benefit from skilled therapeutic intervention in order to improve the following deficits and impairments:  Impaired UE functional use, Increased muscle spasms, Decreased activity tolerance, Improper body mechanics, Hypermobility, Decreased balance, Decreased strength, Impaired sensation, Postural dysfunction  Visit Diagnosis: Muscle weakness (generalized)  Unspecified disturbances of skin sensation     Problem List Patient Active Problem List   Diagnosis Date Noted  . Numbness and tingling of right arm and leg 03/02/2018  . Muscle weakness (generalized) 03/02/2018  . Tremors of nervous system 03/02/2018  . Deformity of right thumb joint 03/02/2018  . Chronic pain of right thumb 03/02/2018  . Chronic neck pain 03/02/2018  .  Bilateral low back pain with sciatica 03/02/2018  . Myofascial pain syndrome, cervical 03/02/2018    Caitlin Braun 04/28/2018, 3:34 PM  [facility] [address] [place], [place], [address] Phone: [phone]   Fax:  [phone]  Name: Caitlin Braun MRN: [id number] Date of Birth: 10-03-1955  Raeford Razor, PT 04/28/18 3:38 PM Phone: [phone] Fax: [phone]

## 2018-05-12 DIAGNOSIS — D225 Melanocytic nevi of trunk: Secondary | ICD-10-CM | POA: Diagnosis not present

## 2018-05-12 DIAGNOSIS — I788 Other diseases of capillaries: Secondary | ICD-10-CM | POA: Diagnosis not present

## 2018-05-12 DIAGNOSIS — L821 Other seborrheic keratosis: Secondary | ICD-10-CM | POA: Diagnosis not present

## 2018-05-20 ENCOUNTER — Ambulatory Visit: Payer: 59 | Admitting: Neurology

## 2018-05-20 ENCOUNTER — Encounter

## 2018-05-20 ENCOUNTER — Ambulatory Visit (INDEPENDENT_AMBULATORY_CARE_PROVIDER_SITE_OTHER): Payer: 59 | Admitting: Neurology

## 2018-05-20 DIAGNOSIS — E559 Vitamin D deficiency, unspecified: Secondary | ICD-10-CM

## 2018-05-20 DIAGNOSIS — G8929 Other chronic pain: Secondary | ICD-10-CM

## 2018-05-20 DIAGNOSIS — Z0289 Encounter for other administrative examinations: Secondary | ICD-10-CM

## 2018-05-20 DIAGNOSIS — R200 Anesthesia of skin: Secondary | ICD-10-CM | POA: Diagnosis not present

## 2018-05-20 DIAGNOSIS — R202 Paresthesia of skin: Secondary | ICD-10-CM

## 2018-05-20 DIAGNOSIS — E538 Deficiency of other specified B group vitamins: Secondary | ICD-10-CM

## 2018-05-20 DIAGNOSIS — R531 Weakness: Secondary | ICD-10-CM | POA: Diagnosis not present

## 2018-05-20 DIAGNOSIS — R5382 Chronic fatigue, unspecified: Secondary | ICD-10-CM

## 2018-05-20 DIAGNOSIS — M5441 Lumbago with sciatica, right side: Secondary | ICD-10-CM

## 2018-05-20 DIAGNOSIS — E531 Pyridoxine deficiency: Secondary | ICD-10-CM

## 2018-05-20 DIAGNOSIS — R29898 Other symptoms and signs involving the musculoskeletal system: Secondary | ICD-10-CM | POA: Diagnosis not present

## 2018-05-20 DIAGNOSIS — M6281 Muscle weakness (generalized): Secondary | ICD-10-CM

## 2018-05-20 DIAGNOSIS — E519 Thiamine deficiency, unspecified: Secondary | ICD-10-CM

## 2018-05-20 DIAGNOSIS — F5104 Psychophysiologic insomnia: Secondary | ICD-10-CM

## 2018-05-20 MED ORDER — ZOLPIDEM TARTRATE 10 MG PO TABS: 10.0000 mg | ORAL_TABLET | Freq: Every day | ORAL | 1 refills | Status: DC

## 2018-05-20 NOTE — Progress Notes (Signed)
Patient with reported low back pain with radiculopathy of right leg, right arm and leg weakness and sensory changes, diffuse pain. Reviewed workup with patient today. EMG/NCS within normal limits. She still has episodes of leg tremoring but she is feeling better and if she tells herself to stop she can stop it. She also still feels symptomatic in her right arm and right leg. But symptoms happen separately so may not be related. She went to physical therapy and she loves it. She is going to start Pilates. Reviewed MRI of the brain and thoracic imaging and labwork to date with patient which is extensive (see below). She has already has MRI cervical spine and lumbar spine at [facility]. Her worst symptoms are the tingling and numbness in the prox arm and legs and the tremor. She asks me for a refill of Ambien, I will give her one refill of ambien but I do not agree with long-term use so she will have to see her primary care to discuss. Needs a referral to a sleep counselor.  Patient needs cognitive behavioral therapy for insomnia. Please refer to [facility]  MRI brain: This MRI of the brain with and without contrast shows the following:  1.   Brain parenchyma appears normal before and after contrast. 2.   7 to 8 mm focus in the right skull.  Surrounding skull appears normal.   This appears to be benign. She should repeat CT head in 6 months to a year to follow right skull focus, explained she should review with pcp and ask her to order as she may not need follow up in neurology. At this time she is looking for a new pcp and understands it is her responsibility to follow up on this. MRI Thoracic Spine: This MRI of the thoracic spine with and without contrast shows the following: 1.   The spinal cord appears normal. 2.   Small disc protrusions at T7-T8, T8-T9 and T10-T11 that do not cause nerve root compression or spinal stenosis. 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 Showed a  small pfo. Emailed Dr. Rayann Heman, no intervention needed.  Parkinsonism?: Pending DAT scan  Normal labs: Vit D, MMA, Lyme, tsh, HIV, esr, sjogrens, pan-anca, b12, folate, rpr, help c, paraneoplatic abs, celiac antibodies, heavy metals, b6, IFE/SPEP, copper, b1, cmo, cbc, ana  Orders Placed This Encounter  Procedures  . Ambulatory referral to [facility] ordered this encounter  Medications  . zolpidem (AMBIEN) 10 MG tablet    Sig: Take 1 tablet (10 mg total) by mouth at bedtime.    Dispense:  30 tablet    Refill:  1     A total of 40 minutes was spent face-to-face with this patient. Over half this time was spent on counseling patient on the  1. Right sided weakness   2. Chronic right-sided low back pain with right-sided sciatica   3. Right leg weakness   4. Numbness and tingling   5. Right hand weakness   6. Chronic fatigue   7. Paresthesias   8. B12 deficiency   9. Vitamin B1 deficiency   10. Vitamin B6 deficiency   11. Vitamin D deficiency   12. Psychophysiological insomnia    diagnosis and different diagnostic and therapeutic options, counseling and coordination of care, risks ans benefits of management, compliance, or risk factor reduction and education.  This does not include time spent on emg/ncs procedure.

## 2018-05-22 NOTE — Procedures (Signed)
Full Name: Caitlin Braun Gender: Female MRN #: [id number] Date of Birth: 04-06-1956    Visit Date: 05/20/2018 09:54 Age: 63 Years 3 Months Old Examining Physician: Sarina Ill, MD      History: Patient with reported low back pain with radiculopathy of right leg, right arm and leg weakness and sensory changes, diffuse pain.  Summary: EMG/NCS was performed on the right upper and right lower extremities. Slightly delayed F Waves within normal limits for long limb length. All nerves and muscles (as indicated in the following tables) were within normal limits.    Conclusion: This is a normal study. No electrophysiologic evidence for mononeuropathy, polyneuropathy, plexopathy, radiculopathy or neuromuscular disorder.  Sarina Ill, M.D.  [facility] [place], [place] [address] Tel: [phone] Fax: [phone]        [facility]    Nerve / Sites Muscle Latency Ref. Amplitude Ref. Rel Amp Segments Distance Velocity Ref. Area    ms ms mV mV %  cm m/s m/s mVms  R Median - APB     Wrist APB 3.8 ?4.4 5.3 ?4.0 100 Wrist - APB 7   24.6     Upper arm APB 8.5  5.9  113 Upper arm - Wrist 23 49 ?49 27.2  R Ulnar - ADM     Wrist ADM 3.3 ?3.3 8.8 ?6.0 100 Wrist - ADM 7   32.2     B.Elbow ADM 7.1  6.4  73.1 B.Elbow - Wrist 19 49 ?49 26.5     A.Elbow ADM 9.6  6.7  104 A.Elbow - B.Elbow 12 49 ?49 28.2         A.Elbow - Wrist      R Peroneal - EDB     Ankle EDB 5.1 ?6.5 4.3 ?2.0 100 Ankle - EDB 9   21.7     Fib head EDB 12.9  3.9  90.7 Fib head - Ankle 34 44 ?44 20.9     Pop fossa EDB 15.1  3.8  98.2 Pop fossa - Fib head 10 45 ?44 20.5         Pop fossa - Ankle      R Tibial - AH     Ankle AH 4.5 ?5.8 7.5 ?4.0 100 Ankle - AH 9   20.1     Pop fossa AH 14.2  6.2  82.6 Pop fossa - Ankle 40 41 ?41 19.3             SNC    Nerve / Sites Rec. Site Peak Lat Ref.  Amp Ref. Segments Distance Peak Diff Ref.    ms ms V V  cm ms ms  R Sural - Ankle (Calf)     Calf Ankle  3.9 ?4.4 13 ?6 Calf - Ankle 14    R Superficial peroneal - Ankle     Lat leg Ankle 4.1 ?4.4 12 ?6 Lat leg - Ankle 14    R Median, Ulnar - Transcarpal comparison     Median Palm Wrist 2.2 ?2.2 57 ?35 Median Palm - Wrist 8       Ulnar Palm Wrist 2.2 ?2.2 32 ?12 Ulnar Palm - Wrist 8          Median Palm - Ulnar Palm  0.0 ?0.4  R Median - Orthodromic (Dig II, Mid palm)     Dig II Wrist 3.4 ?3.4 22 ?10 Dig II - Wrist 13    R Ulnar - Orthodromic, (Dig  V, Mid palm)     Dig V Wrist 3.1 ?3.1 14 ?5 Dig V - Wrist 56                 F  Wave    Nerve F Lat Ref.   ms ms  R Tibial - AH 58.4 ?56.0  R Ulnar - ADM 33.0 ?32.0         EMG full       EMG Summary Table    Spontaneous MUAP Recruitment  Muscle IA Fib PSW Fasc Other Amp Dur. Poly Pattern  R. Deltoid Normal None None None _______ Normal Normal Normal Normal  R. Triceps brachii Normal None None None _______ Normal Normal Normal Normal  R. Biceps brachii Normal None None None _______ Normal Normal Normal Normal  R. Pronator teres Normal None None None _______ Normal Normal Normal Normal  R. First dorsal interosseous Normal None None None _______ Normal Normal Normal Normal  R. Vastus medialis Normal None None None _______ Normal Normal Normal Normal  R. Gastrocnemius (Medial head) Normal None None None _______ Normal Normal Normal Normal  R. Tibialis anterior Normal None None None _______ Normal Normal Normal Normal  R. Extensor hallucis longus Normal None None None _______ Normal Normal Normal Normal  R. Cervical paraspinals (up) Normal None None None _______ Normal Normal Normal Normal  R. Biceps femoris (long head) Normal None None None _______ Normal Normal Normal Normal  R. Gluteus maximus Normal None None None _______ Normal Normal Normal Normal  R. Gluteus medius Normal None None None _______ Normal Normal Normal Normal  R. Lumbar paraspinals (low) Normal None None None _______ Normal Normal Normal Normal

## 2018-05-22 NOTE — Progress Notes (Signed)
See procedure note.

## 2018-05-22 NOTE — Progress Notes (Signed)
Full Name: Caitlin Braun Gender: Female MRN #: [id number] Date of Birth: Nov 02, 1955    Visit Date: 05/20/2018 09:54 Age: 63 Years 3 Months Old Examining Physician: Sarina Ill, MD      History: Patient with reported low back pain with radiculopathy of right leg, right arm and leg weakness and sensory changes, diffuse pain.  Summary: EMG/NCS was performed on the right upper and right lower extremities. Slightly delayed F Waves within normal limits for long limb length. All nerves and muscles (as indicated in the following tables) were within normal limits.    Conclusion: This is a normal study. No electrophysiologic evidence for mononeuropathy, polyneuropathy, plexopathy, radiculopathy or neuromuscular disorder.  Sarina Ill, M.D.  [facility] [place], [place] [address] Tel: [phone] Fax: [phone]        [facility]    Nerve / Sites Muscle Latency Ref. Amplitude Ref. Rel Amp Segments Distance Velocity Ref. Area    ms ms mV mV %  cm m/s m/s mVms  R Median - APB     Wrist APB 3.8 ?4.4 5.3 ?4.0 100 Wrist - APB 7   24.6     Upper arm APB 8.5  5.9  113 Upper arm - Wrist 23 49 ?49 27.2  R Ulnar - ADM     Wrist ADM 3.3 ?3.3 8.8 ?6.0 100 Wrist - ADM 7   32.2     B.Elbow ADM 7.1  6.4  73.1 B.Elbow - Wrist 19 49 ?49 26.5     A.Elbow ADM 9.6  6.7  104 A.Elbow - B.Elbow 12 49 ?49 28.2         A.Elbow - Wrist      R Peroneal - EDB     Ankle EDB 5.1 ?6.5 4.3 ?2.0 100 Ankle - EDB 9   21.7     Fib head EDB 12.9  3.9  90.7 Fib head - Ankle 34 44 ?44 20.9     Pop fossa EDB 15.1  3.8  98.2 Pop fossa - Fib head 10 45 ?44 20.5         Pop fossa - Ankle      R Tibial - AH     Ankle AH 4.5 ?5.8 7.5 ?4.0 100 Ankle - AH 9   20.1     Pop fossa AH 14.2  6.2  82.6 Pop fossa - Ankle 40 41 ?41 19.3             SNC    Nerve / Sites Rec. Site Peak Lat Ref.  Amp Ref. Segments Distance Peak Diff Ref.    ms ms V V  cm ms ms  R Sural - Ankle (Calf)     Calf Ankle  3.9 ?4.4 13 ?6 Calf - Ankle 14    R Superficial peroneal - Ankle     Lat leg Ankle 4.1 ?4.4 12 ?6 Lat leg - Ankle 14    R Median, Ulnar - Transcarpal comparison     Median Palm Wrist 2.2 ?2.2 57 ?35 Median Palm - Wrist 8       Ulnar Palm Wrist 2.2 ?2.2 32 ?12 Ulnar Palm - Wrist 8          Median Palm - Ulnar Palm  0.0 ?0.4  R Median - Orthodromic (Dig II, Mid palm)     Dig II Wrist 3.4 ?3.4 22 ?10 Dig II - Wrist 13    R Ulnar - Orthodromic, (Dig  V, Mid palm)     Dig V Wrist 3.1 ?3.1 14 ?5 Dig V - Wrist 18                 F  Wave    Nerve F Lat Ref.   ms ms  R Tibial - AH 58.4 ?56.0  R Ulnar - ADM 33.0 ?32.0         EMG full       EMG Summary Table    Spontaneous MUAP Recruitment  Muscle IA Fib PSW Fasc Other Amp Dur. Poly Pattern  R. Deltoid Normal None None None _______ Normal Normal Normal Normal  R. Triceps brachii Normal None None None _______ Normal Normal Normal Normal  R. Biceps brachii Normal None None None _______ Normal Normal Normal Normal  R. Pronator teres Normal None None None _______ Normal Normal Normal Normal  R. First dorsal interosseous Normal None None None _______ Normal Normal Normal Normal  R. Vastus medialis Normal None None None _______ Normal Normal Normal Normal  R. Gastrocnemius (Medial head) Normal None None None _______ Normal Normal Normal Normal  R. Tibialis anterior Normal None None None _______ Normal Normal Normal Normal  R. Extensor hallucis longus Normal None None None _______ Normal Normal Normal Normal  R. Cervical paraspinals (up) Normal None None None _______ Normal Normal Normal Normal  R. Biceps femoris (long head) Normal None None None _______ Normal Normal Normal Normal  R. Gluteus maximus Normal None None None _______ Normal Normal Normal Normal  R. Gluteus medius Normal None None None _______ Normal Normal Normal Normal  R. Lumbar paraspinals (low) Normal None None None _______ Normal Normal Normal Normal

## 2018-06-03 ENCOUNTER — Other Ambulatory Visit: Payer: Self-pay | Admitting: Neurology

## 2018-06-03 ENCOUNTER — Ambulatory Visit (HOSPITAL_COMMUNITY)
Admission: RE | Admit: 2018-06-03 | Discharge: 2018-06-03 | Disposition: A | Discharge status: Home / Self Care | Payer: 59 | Source: Ambulatory Visit | Attending: Neurology | Admitting: Neurology

## 2018-06-03 ENCOUNTER — Telehealth: Payer: Self-pay | Admitting: Neurology

## 2018-06-03 DIAGNOSIS — G20 Parkinson's disease: Secondary | ICD-10-CM

## 2018-06-03 MED ORDER — IODINE STRONG (LUGOLS) 5 % PO SOLN
0.8000 mL | Freq: Once | ORAL | Status: AC
  Administered 2018-06-03: 0.8 mL via ORAL
  Filled 2018-06-03: qty 0.8

## 2018-06-03 MED ORDER — IOFLUPANE I 123 185 MBQ/2.5ML IV SOLN
4.3700 | Freq: Once | INTRAVENOUS | Status: AC
  Administered 2018-06-03: 4.37 via INTRAVENOUS

## 2018-06-03 NOTE — Telephone Encounter (Signed)
Discussed results with patient:  DAT Scan: IMPRESSION:Loss of dopamine transport activity in the bilateral striata is a pattern typical of Parkinson's syndrome pathology.  Discussed second opinions. Will refer to [facility].

## 2018-06-04 ENCOUNTER — Encounter: Payer: Self-pay | Admitting: Neurology

## 2018-06-10 NOTE — Progress Notes (Signed)
Thanks

## 2018-06-11 DIAGNOSIS — M67912 Unspecified disorder of synovium and tendon, left shoulder: Secondary | ICD-10-CM | POA: Diagnosis not present

## 2018-06-17 NOTE — Progress Notes (Signed)
Caitlin Braun was seen today in the movement disorders clinic for neurologic consultation at the request of Melvenia Beam, MD.  The consultation is for the evaluation of second opinion for parkinsonism.  This patient is accompanied in the office by her spouse who supplements the history. Multiple medical records have been reviewed.  Case was also discussed with Dr. Jaynee Eagles.  Patient presented to [facility] neurology in November, 2019 with complaints of low back pain, lethargy, diffuse joint pain, tremor in the right arm, weakness in the right hand and a myriad of other complaints.  She noted trouble typing on a computer and trouble with writing x 2 years (looking back).  She had an extensive neurologic work-up.  MRI of the thoracic spine was performed which was nonrevealing.  MRI of the brain was performed with and without gadolinium on March 26, 2018.  I personally reviewed this.  Intraparenchymally, this was normal.  There is reported to be a 7 to 8 mm "focus" of hyperintensity in the right skull, that was reported to be benign appearing.  She had apparently already had MRI of the cervical and lumbar spine with Dr. Vertell Limber.  She has had extensive laboratory testing done.  Dr. Jaynee Eagles did an EMG on 05/20/18 of the right upper and lower extremities.  This was reported to be normal.  She had an echocardiogram as it was felt that she had some Marfan-like features.  This demonstrated a small PFO.  Her cardiologist felt no intervention was needed.  DaTscan was completed on June 03, 2018.  This demonstrated decreased dopamine in the left much greater than right striatum (personally reviewed this).   Specific Symptoms:  Tremor: Yes.   - started in the R leg when she would sit on the toilet.  Wasn't visible and could just feel it internally.  This started about 2 years ago.  More recently, she noted some tremor in the R hand when she would go to put down an object in R hand (never with picking it up).   Family  hx of similar:  No. Voice: no change Sleep: trouble getting and trouble staying asleep (chronic since her 55s and "I have created anxiety over my sleep and I am definitely an [place] and xanax type of girl")  Vivid Dreams:  No.  Acting out dreams:  No. Wet Pillows: No. Postural symptoms:  Yes.   x 1.5 years.  Noted some more trouble in yoga class about 1.5 years ago (things she could previously do).  She subsequently stopped yoga as wondered if just a pinched nerve  Falls?  No. Bradykinesia symptoms: no bradykinesia noted Loss of smell:  No. Loss of taste:  No. Urinary Incontinence:  No., does have nocturia (amitrypyline helped but rash after 6 weeks), frequency Difficulty Swallowing:  Very rare issue Handwriting, micrographia: Yes.   Trouble with ADL's:  No.  Trouble buttoning clothing: occasionally Depression:  No. Memory changes:  No. Hallucinations:  No.  visual distortions: No. N/V:  No. Lightheaded:  No.  Syncope: No. Diplopia:  No. Dyskinesia:  No. Prior exposure to reglan/antipsychotics: No.   PREVIOUS MEDICATIONS: none to date  ALLERGIES:   Allergies  Allergen Reactions   Augmentin [Amoxicillin-Pot Clavulanate] Hives   Perflutren Lipid Microsphere Other (See Comments)    Back pain    CURRENT MEDICATIONS:  Outpatient Encounter Medications as of 06/21/2018  Medication Sig   ALPRAZolam (XANAX) 0.25 MG tablet Take 0.25 mg by mouth as needed.    Biotin  w/ Vitamins C & E (HAIR/SKIN/NAILS PO) Take by mouth.   Calcium Carb-Cholecalciferol (CALCIUM + D3 PO) Take 500 mg by mouth daily.   Cholecalciferol (VITAMIN D3 PO) Take 2,000 Int'l Units by mouth daily.   Fexofenadine-Pseudoephedrine (ALLEGRA-D 12 HOUR PO) Take 1 tablet by mouth daily.    FLUTICASONE PROPIONATE NA Place 1 spray into both nostrils at bedtime.   ketotifen (ZADITOR) 0.025 % ophthalmic solution Place 1 drop into both eyes as needed.   KRILL OIL PO Take 500 mg by mouth daily.   MAGNESIUM PO  Take by mouth at bedtime.   Minoxidil (ROGAINE EX) Apply 2 drops topically daily.   montelukast (SINGULAIR) 10 MG tablet Take 10 mg by mouth at bedtime.   Multiple Vitamin (MULTIVITAMIN) tablet Take 1 tablet by mouth daily.   Potassium Gluconate 550 MG TABS Take 1 tablet by mouth daily.   Probiotic Product (PROBIOTIC PO) Take by mouth.   Wheat Dextrin (BENEFIBER PO) Take by mouth.   zolpidem (AMBIEN) 10 MG tablet Take 1 tablet (10 mg total) by mouth at bedtime.   No facility-administered encounter medications on file as of 06/21/2018.     PAST MEDICAL HISTORY:   Past Medical History:  Diagnosis Date   Osteoporosis     PAST SURGICAL HISTORY:   Past Surgical History:  Procedure Laterality Date   ABDOMINAL HYSTERECTOMY     AUGMENTATION MAMMAPLASTY Bilateral 2016, 1998   Patient had them redone in 2016   COSMETIC SURGERY     face lift     NOSE SURGERY     cosmetic    SOCIAL HISTORY:   Social History   Socioeconomic History   Marital status: Married    Spouse name: Not on file   Number of children: 3   Years of education: Not on file   Highest education level: Bachelor's degree (e.g., BA, AB, BS)  Occupational History   Not on file  Social Needs   Financial resource strain: Not on file   Food insecurity:    Worry: Not on file    Inability: Not on file   Transportation needs:    Medical: Not on file    Non-medical: Not on file  Tobacco Use   Smoking status: Never Smoker   Smokeless tobacco: Never Used  Substance and Sexual Activity   Alcohol use: Yes    Alcohol/week: 3.0 standard drinks    Types: 3 Standard drinks or equivalent per week   Drug use: No   Sexual activity: Not on file  Lifestyle   Physical activity:    Days per week: Not on file    Minutes per session: Not on file   Stress: Not on file  Relationships   Social connections:    Talks on phone: Not on file    Gets together: Not on file    Attends religious service: Not  on file    Active member of club or organization: Not on file    Attends meetings of clubs or organizations: Not on file    Relationship status: Not on file   Intimate partner violence:    Fear of current or ex partner: Not on file    Emotionally abused: Not on file    Physically abused: Not on file    Forced sexual activity: Not on file  Other Topics Concern   Not on file  Social History Narrative   Lives at home with husband    FAMILY HISTORY:   Family Status  Relation Name Status   Mother  Deceased at age 46   Father  Deceased at age 59   Sister  Deceased   Brother  Deceased at age 31   Neg Hx  (Not Specified)    ROS:  Review of Systems  Constitutional: Positive for malaise/fatigue.  HENT: Negative.   Eyes:       Eye "fatigue"  Respiratory: Negative.   Cardiovascular: Negative.   Gastrointestinal: Negative.   Genitourinary: Positive for frequency and urgency.  Skin: Negative.   Neurological: Positive for focal weakness (R hand with opening jars).  Endo/Heme/Allergies: Negative.   Psychiatric/Behavioral: Negative.     PHYSICAL EXAMINATION:    VITALS:   Vitals:   06/21/18 0922  BP: 100/66  Pulse: 72  Temp: (!) 97.4 F (36.3 C)  TempSrc: Oral  SpO2: 97%  Weight: 123 lb (55.8 kg)  Height: 5' 10.5" (1.791 m)    GEN:  The patient appears stated age and is in NAD. HEENT:  Normocephalic, atraumatic.  The mucous membranes are moist. The superficial temporal arteries are without ropiness or tenderness. CV:  RRR Lungs:  CTAB Neck/HEME:  There are no carotid bruits bilaterally.  Neurological examination:  Orientation: The patient is alert and oriented x3. Fund of knowledge is appropriate.  Recent and remote memory are intact.  Attention and concentration are normal.    Able to name objects and repeat phrases. Cranial nerves: There is good facial symmetry. There is facial hypomimia.  Pupils are equal round and reactive to light bilaterally. Fundoscopic  exam reveals clear margins bilaterally. Extraocular muscles are intact. The visual fields are full to confrontational testing. The speech is fluent and clear. The patient is able to make the gutteral sounds without difficulty.  Soft palate rises symmetrically and there is no tongue deviation. Hearing is intact to conversational tone. Sensation: Sensation is intact to light and pinprick throughout (facial, trunk, extremities). Vibration is intact at the bilateral big toe. There is no extinction with double simultaneous stimulation. There is no sensory dermatomal level identified. Motor: Strength is 5/5 in the bilateral upper and lower extremities.   Shoulder shrug is equal and symmetric.  There is no pronator drift. Deep tendon reflexes: Deep tendon reflexes are 2+/4 at the bilateral biceps, triceps, brachioradialis, patella and achilles. Plantar responses are downgoing bilaterally.  Movement examination: Tone: There is mild to mod increased tone in the RUE.  Tone elsewhere is normal Abnormal movements: There is no tremor even with distraction Coordination:  There is decremation with RAM's, including alternating supination and pronation of the forearm, hand opening and closing, finger taps on the right.  There is no trouble with heel/toe taps on the right.   Gait and Station: The patient has no difficulty arising out of a deep-seated chair without the use of the hands. The patient's stride length is normal with decreased arm swing on the right.  The patient has a negative pull test.      Office Visit on 03/02/2018  Component Date Value Ref Range Status   Vit D, 25-Hydroxy 03/02/2018 37.9  30.0 - 100.0 ng/mL Final   Comment: Vitamin D deficiency has been defined by the [place] practice guideline as a level of serum 25-OH vitamin D less than 20 ng/mL (1,2). The Endocrine Society went on to further define vitamin D insufficiency as a level between 21 and 29 ng/mL  (2). 1. IOM (Institute of Medicine). 2010. Dietary reference    intakes for  calcium and D. [place]: The    [employer]. 2. Holick MF, Binkley Welcome, Bischoff-Ferrari HA, et al.    Evaluation, treatment, and prevention of vitamin D    deficiency: an Endocrine Society clinical practice    guideline. JCEM. 2011 Jul; 96(7):1911-30.    Methylmalonic Acid 03/02/2018 78  0 - 378 nmol/L Final   Disclaimer: 03/02/2018 Comment   Final   Comment: This test was developed and its performance characteristics determined by [facility]. It has not been cleared or approved by the Food and Drug Administration.    Lyme IgG/IgM Ab 03/02/2018 <0.91  0.00 - 0.90 ISR Final   Comment:                                 Negative         <0.91                                 Equivocal  0.91 - 1.09                                 Positive         >1.09    TSH 03/02/2018 2.740  0.450 - 4.500 uIU/mL Final   HIV Screen 4th Generation wRfx 03/02/2018 Non Reactive  Non Reactive Final   Sed Rate 03/02/2018 9  0 - 40 mm/hr Final   ENA SSA (RO) Ab 03/02/2018 <0.2  0.0 - 0.9 AI Final   ENA SSB (LA) Ab 03/02/2018 <0.2  0.0 - 0.9 AI Final   Myeloperoxidase Ab 03/02/2018 <9.0  0.0 - 9.0 U/mL Final   ANCA Proteinase 3 03/02/2018 <3.5  0.0 - 3.5 U/mL Final   C-ANCA 03/02/2018 <1:20  Neg:<1:20 titer Final   P-ANCA 03/02/2018 <1:20  Neg:<1:20 titer Final   Comment: The presence of positive fluorescence exhibiting P-ANCA or C-ANCA patterns alone is not specific for the diagnosis of Wegener's Granulomatosis (WG) or microscopic polyangiitis. Decisions about treatment should not be based solely on ANCA IFA results.  The International ANCA Group Consensus recommends follow up testing of positive sera with both PR-3 and MPO-ANCA enzyme immunoassays. As many as 5% serum samples are positive only by EIA. Ref. AM J Clin Pathol 1999;111:507-513.    Atypical pANCA 03/02/2018 <1:20  Neg:<1:20 titer Final    Comment: The atypical pANCA pattern has been observed in a significant percentage of patients with ulcerative colitis, primary sclerosing cholangitis and autoimmune hepatitis.    Vitamin B-12 03/02/2018 1,070  232 - 1,245 pg/mL Final   Folate 03/02/2018 13.7  >3.0 ng/mL Final   Comment: A serum folate concentration of less than 3.1 ng/mL is considered to represent clinical deficiency.    RPR Ser Ql 03/02/2018 Non Reactive  Non Reactive Final   Hep C Virus Ab 03/02/2018 <0.1  0.0 - 0.9 s/co ratio Final   Comment:                                   Negative:     < 0.8                              Indeterminate: 0.8 -  0.9                                   Positive:     > 0.9  The CDC recommends that a positive HCV antibody result  be followed up with a HCV Nucleic Acid Amplification  test (242353).    Neuronal Nuclear (Hu) Antibody (IB) 03/02/2018 <1:10  titer Final   Comment: Reference Range: Negative: < 1:10 titer Positive: => 1:10 titer    Purkinje Cell (Yo) Autoantobodies-* 03/02/2018 <1:10  titer Final   Comment: Reference Range: Negative: < 1:10 titer Positive: => 1:10 titer    Neuronal Nuc Ab (Ri), IFA 03/02/2018 <1:10  titer Final   Comment: Reference Range: Negative: < 1:10 titer Positive: => 1:10 titer Antibodies to Hu antigen also called antineuronal nuclear antibody Eleanora Neighbor) are present in patients with paraneoplastic neurologic syndrome such as Encephalomyelitis and are frequently associated with small-cell lung cancer and neuroblastoma and rarely with non-small cell lung cancer, prostate cancer or seminoma. Antibodies to Yo are associated with paraneoplastic disorders (cerebellar degeneration) occurring in patients with small cell lung cancer (SCLC), gynecologic or breast cancer. Antibodies to Ri, also referred to as anti-neuronal nuclear antibody type 2 (ANNA-2) is associated with paraneoplastic syndromes. The paraneoplastic syndromes associated with  the presence of Ri antibodies include opsoclonus, ataxia, nystagmus, dizziness and dysarthria. *This test has been developed and performance parameters have been validated by [facility]. This test has not been approved by the U.S. Food and                           Drug Administration (FDA); however, [place] FDA approval is not required for clinical use. It is not intended that clinical diagnosis and patient management decisions be made using these results alone. This test has been validated using serum samples. The manufacturer has not determined the efficacy of this test when performed on CSF, plasma, joint or pleural fluid specimens. The performance characteristics of this test were determined by [facility].    Transglutaminase IgA 03/02/2018 <2  0 - 3 U/mL Final   Comment:                               Negative        0 -  3                               Weak Positive   4 - 10                               Positive           >10  Tissue Transglutaminase (tTG) has been identified  as the endomysial antigen.  Studies have demonstr-  ated that endomysial IgA antibodies have over 99%  specificity for gluten sensitive enteropathy.    Antigliadin Abs, IgA 03/02/2018 4  0 - 19 units Final   Comment:                    Negative                   0 - 19  Weak Positive             20 - 30                    Moderate to Strong Positive   >30    Gliadin IgG 03/02/2018 3  0 - 19 units Final   Comment:                    Negative                   0 - 19                    Weak Positive             20 - 30                    Moderate to Strong Positive   >30    Lead, Blood 03/02/2018 None Detected  0 - 4 ug/dL Final   Comment: Testing performed by [employer] Estate manager/land agent.                           Environmental Exposure:                            WHO Recommendation    <20                           Occupational Exposure:                             OSHA Lead Std          40                            BEI                    30                                 Detection Limit =  1 This test was developed and its performance characteristics determined by [facility]. It has not been cleared or approved by the Food and Drug Administration.    Arsenic 03/02/2018 8  2 - 23 ug/L Final                                   Detection Limit = 1   Mercury 03/02/2018 1.0  0.0 - 14.9 ug/L Final   Comment:                         Environmental Exposure:  <15.0                         Occupational Exposure:                          BEI - Inorganic Mercury: 15.0  Detection Limit =  1.0    Vitamin B6 03/02/2018 19.7  2.0 - 32.8 ug/L Final   IgG (Immunoglobin G), Serum 03/02/2018 1,273  700 - 1,600 mg/dL Final   IgA/Immunoglobulin A, Serum 03/02/2018 163  87 - 352 mg/dL Final   IgM (Immunoglobulin M), Srm 03/02/2018 333* 26 - 217 mg/dL Final   Albumin SerPl Elph-Mcnc 03/02/2018 4.5* 2.9 - 4.4 g/dL Final   Alpha 1 03/02/2018 0.2  0.0 - 0.4 g/dL Final   Alpha2 Glob SerPl Elph-Mcnc 03/02/2018 0.6  0.4 - 1.0 g/dL Final   B-Globulin SerPl Elph-Mcnc 03/02/2018 0.8  0.7 - 1.3 g/dL Final   Gamma Glob SerPl Elph-Mcnc 03/02/2018 1.5  0.4 - 1.8 g/dL Final   M Protein SerPl Elph-Mcnc 03/02/2018 Not Observed  Not Observed g/dL Final   Globulin, Total 03/02/2018 3.0  2.2 - 3.9 g/dL Final   Albumin/Glob SerPl 03/02/2018 1.6  0.7 - 1.7 Final   IFE 1 03/02/2018 Comment   Final   Comment: An apparent polyclonal gammopathy: IgM. Kappa and lambda typing appear increased.    Please Note 03/02/2018 Comment   Final   Comment: Protein electrophoresis scan will follow via computer, mail, or courier delivery.    Copper 03/02/2018 106  72 - 166 ug/dL Final                                   Detection Limit = 5   Thiamine 03/02/2018 115.0  66.5 - 200.0 nmol/L Final   Glucose 03/02/2018 87  65 - 99 mg/dL  Final   BUN 03/02/2018 17  8 - 27 mg/dL Final   Creatinine, Ser 03/02/2018 0.60  0.57 - 1.00 mg/dL Final   GFR calc non Af Amer 03/02/2018 98  >59 mL/min/1.73 Final   GFR calc Af Amer 03/02/2018 113  >59 mL/min/1.73 Final   BUN/Creatinine Ratio 03/02/2018 28  12 - 28 Final   Sodium 03/02/2018 141  134 - 144 mmol/L Final   Potassium 03/02/2018 4.2  3.5 - 5.2 mmol/L Final   Chloride 03/02/2018 100  96 - 106 mmol/L Final   CO2 03/02/2018 25  20 - 29 mmol/L Final   Calcium 03/02/2018 9.9  8.7 - 10.3 mg/dL Final   Total Protein 03/02/2018 7.5  6.0 - 8.5 g/dL Final   Albumin 03/02/2018 5.0* 3.6 - 4.8 g/dL Final   Globulin, Total 03/02/2018 2.5  1.5 - 4.5 g/dL Final   Albumin/Globulin Ratio 03/02/2018 2.0  1.2 - 2.2 Final   Bilirubin Total 03/02/2018 0.9  0.0 - 1.2 mg/dL Final   Alkaline Phosphatase 03/02/2018 66  39 - 117 IU/L Final   AST 03/02/2018 21  0 - 40 IU/L Final   ALT 03/02/2018 13  0 - 32 IU/L Final   WBC 03/02/2018 5.3  3.4 - 10.8 x10E3/uL Final   RBC 03/02/2018 4.16  3.77 - 5.28 x10E6/uL Final   Hemoglobin 03/02/2018 11.8  11.1 - 15.9 g/dL Final   Hematocrit 03/02/2018 36.5  34.0 - 46.6 % Final   MCV 03/02/2018 88  79 - 97 fL Final   MCH 03/02/2018 28.4  26.6 - 33.0 pg Final   MCHC 03/02/2018 32.3  31.5 - 35.7 g/dL Final   RDW 03/02/2018 13.7  12.3 - 15.4 % Final   Platelets 03/02/2018 146* 150 - 450 x10E3/uL Final   ANA Titer 1 03/02/2018 Negative   Final   Comment:  Negative   <1:80                                      Borderline  1:80                                      Positive   >1:80    Rhuematoid fact SerPl-aCnc 03/02/2018 11.3  0.0 - 13.9 IU/mL Final   Cyclic Citrullin Peptide Ab 03/02/2018 14  0 - 19 units Final   Comment:                           Negative               <20                           Weak positive      20 - 39                           Moderate positive  40 - 59                            Strong positive        >59   ,  ASSESSMENT/PLAN:  1.  Parkinsonism.  I suspect that this does represent idiopathic Parkinson's disease.  This is likely akinetic rigid type.  An atypical state cannot be r/o but she does not have any of the early s/s of those so doubtful.    -We discussed the diagnosis as well as pathophysiology of the disease.  We discussed treatment options as well as prognostic indicators.  Patient education was provided.  -We discussed that it used to be thought that levodopa would increase risk of melanoma but now it is believed that Parkinsons itself likely increases risk of melanoma. she is to get regular skin checks.  -Greater than 50% of the 60 minute visit was spent in counseling answering questions and talking about what to expect now as well as in the future.  We talked about medication options as well as potential future surgical options.  We talked about safety in the home.  -We decided to add pramipexole and work to 0.5 mg tid.  We discussed risks, benefits, and side effects, which included but were not limited to sleep attacks and compulsive behaviors.  Understanding was expressed.  -We discussed community resources in the area including patient support groups and community exercise programs for PD and pt education was provided to the patient.  -Patient and husband had many questions and I answered them to the best of my ability today.  2.  Patient will be following up with Dr. Jaynee Eagles and has an appointment in a few days.  She will f/u with me prn.  Time with the patient did not include the 40 min of record review which was detailed above, which was non face to face time.   Cc:  Patient, No Pcp Per

## 2018-06-21 ENCOUNTER — Ambulatory Visit: Payer: 59 | Admitting: Neurology

## 2018-06-21 ENCOUNTER — Other Ambulatory Visit: Payer: Self-pay

## 2018-06-21 ENCOUNTER — Encounter: Payer: Self-pay | Admitting: Neurology

## 2018-06-21 VITALS — BP 100/66 | HR 72 | Temp 97.4°F | Ht 70.5 in | Wt 123.0 lb

## 2018-06-21 DIAGNOSIS — G20A1 Parkinson's disease without dyskinesia, without mention of fluctuations: Secondary | ICD-10-CM | POA: Insufficient documentation

## 2018-06-21 DIAGNOSIS — G20 Parkinson's disease: Secondary | ICD-10-CM

## 2018-06-21 MED ORDER — PRAMIPEXOLE DIHYDROCHLORIDE 0.5 MG PO TABS: 0.5000 mg | ORAL_TABLET | Freq: Three times a day (TID) | ORAL | 2 refills | Status: DC

## 2018-06-21 MED ORDER — PRAMIPEXOLE DIHYDROCHLORIDE 0.125 MG PO TABS: ORAL_TABLET | ORAL | 0 refills | Status: DC

## 2018-06-21 NOTE — Patient Instructions (Signed)
1. Start mirapex (pramipexole) as follows:  0.125 mg - 1 tablet three times per day for a week, then 2 tablets three times per day for a week and then fill the 0.5 mg tablet and take that, 1 pill three times per day

## 2018-06-23 ENCOUNTER — Telehealth: Payer: Self-pay | Admitting: *Deleted

## 2018-06-23 ENCOUNTER — Ambulatory Visit: Payer: 59 | Admitting: Neurology

## 2018-06-23 MED ORDER — ROTIGOTINE 1 MG/24HR TD PT24: MEDICATED_PATCH | TRANSDERMAL | 0 refills | Status: DC

## 2018-06-23 MED ORDER — ROTIGOTINE 2 MG/24HR TD PT24: MEDICATED_PATCH | TRANSDERMAL | 0 refills | Status: DC

## 2018-06-23 NOTE — Telephone Encounter (Signed)
Pt came by to pickup samples. She spoke with Dr. Jaynee Eagles on the phone earlier and discussed trying Neupro. Per Dr. Jaynee Eagles, pt advised not take this with the Mirapex. Sample orders placed per v.o. Dr. Jaynee Eagles for pt to try a 1 mg patch daily x 2 weeks then increase to a 2 mg daily. 4 weeks total samples given and copay card. Pt also given a copy of her med list with directions. Discussed she needs to remove old patch each time she puts on a new patch. She will email in a few weeks to let us know and it can be prescribed. She was given a copay card too.

## 2018-06-28 DIAGNOSIS — J301 Allergic rhinitis due to pollen: Secondary | ICD-10-CM | POA: Diagnosis not present

## 2018-06-28 DIAGNOSIS — R351 Nocturia: Secondary | ICD-10-CM | POA: Diagnosis not present

## 2018-06-28 DIAGNOSIS — J3089 Other allergic rhinitis: Secondary | ICD-10-CM | POA: Diagnosis not present

## 2018-06-28 DIAGNOSIS — H1045 Other chronic allergic conjunctivitis: Secondary | ICD-10-CM | POA: Diagnosis not present

## 2018-07-06 ENCOUNTER — Telehealth: Payer: Self-pay

## 2018-07-06 NOTE — Telephone Encounter (Signed)
Notes on file

## 2018-07-13 MED ORDER — ROTIGOTINE 2 MG/24HR TD PT24: MEDICATED_PATCH | TRANSDERMAL | 1 refills | Status: DC

## 2018-07-14 DIAGNOSIS — Z681 Body mass index (BMI) 19 or less, adult: Secondary | ICD-10-CM | POA: Diagnosis not present

## 2018-07-14 DIAGNOSIS — Z01419 Encounter for gynecological examination (general) (routine) without abnormal findings: Secondary | ICD-10-CM | POA: Diagnosis not present

## 2018-08-19 ENCOUNTER — Encounter: Payer: Self-pay | Admitting: *Deleted

## 2018-08-19 ENCOUNTER — Telehealth: Payer: Self-pay | Admitting: *Deleted

## 2018-08-19 NOTE — Telephone Encounter (Addendum)
Spoke with pt and discussed that d/t COVID19 pandemic, our office is severely reducing in person visits at this time. Her questions were answered. She provided consent to convert 5/20 appt to telemedicine visit, using [email or web address] video website. She provided consent to file visit with insurance and understands there may be copay/pt responsible charge involved. She understands how to sign in and click the link contained in email. Pt's email confirmed as [email or web address]. She confirmed DOB & name and I updated her chart including history & meds. She understands she will receive a call 30 minutes prior to her appt to check-in and then she should join the meeting 5-10 minutes prior to her scheduled appt time at 1:00 pm.   [email or web address] link sent to [email or web address]

## 2018-08-25 ENCOUNTER — Other Ambulatory Visit: Payer: Self-pay

## 2018-08-25 ENCOUNTER — Ambulatory Visit (INDEPENDENT_AMBULATORY_CARE_PROVIDER_SITE_OTHER): Payer: 59 | Admitting: Neurology

## 2018-08-25 DIAGNOSIS — Q759 Congenital malformation of skull and face bones, unspecified: Secondary | ICD-10-CM

## 2018-08-25 DIAGNOSIS — G20 Parkinson's disease: Secondary | ICD-10-CM

## 2018-08-25 DIAGNOSIS — M899 Disorder of bone, unspecified: Secondary | ICD-10-CM

## 2018-08-25 DIAGNOSIS — M5440 Lumbago with sciatica, unspecified side: Secondary | ICD-10-CM

## 2018-08-25 DIAGNOSIS — M852 Hyperostosis of skull: Secondary | ICD-10-CM

## 2018-08-25 DIAGNOSIS — M5416 Radiculopathy, lumbar region: Secondary | ICD-10-CM | POA: Diagnosis not present

## 2018-08-25 DIAGNOSIS — G4752 REM sleep behavior disorder: Secondary | ICD-10-CM

## 2018-08-25 MED ORDER — ROTIGOTINE 4 MG/24HR TD PT24: 1.0000 | MEDICATED_PATCH | Freq: Every day | TRANSDERMAL | 0 refills | Status: DC

## 2018-08-25 MED ORDER — CLONAZEPAM 1 MG PO TABS: 1.0000 mg | ORAL_TABLET | Freq: Every day | ORAL | 4 refills | Status: DC

## 2018-08-25 MED ORDER — ROTIGOTINE 1 MG/24HR TD PT24: 1.0000 mg | MEDICATED_PATCH | Freq: Every day | TRANSDERMAL | 0 refills | Status: DC

## 2018-08-25 NOTE — Progress Notes (Signed)
[facility]    Provider:  Dr Jaynee Eagles  CC:  Parkinson's disease:  Virtual Visit via Video Note  I connected with Caitlin Braun and her husband on 08/25/18 at  1:00 PM EDT by a video enabled telemedicine application and verified that I am speaking with the correct person using two identifiers.  Location: Patient: home Provider: office   I discussed the limitations of evaluation and management by telemedicine and the availability of in person appointments. The patient expressed understanding and agreed to proceed.  Follow Up Instructions:    I discussed the assessment and treatment plan with the patient. The patient was provided an opportunity to ask questions and all were answered. The patient agreed with the plan and demonstrated an understanding of the instructions.   The patient was advised to call back or seek an in-person evaluation if the symptoms worsen or if the condition fails to improve as anticipated.  I provided 45 minutes of non-face-to-face time during this encounter.   Melvenia Beam, MD   Interval history Aug 25, 2018: I connected with this lovely patient and her husband today to follow-up on her Parkinson's disease.  She was started on oral dopamine agonists and had a sleep attack she was switched to the Neupro patch which is working quite well for her.  She feels as though she could increase it.  On the 2 mg, felt better. She still has insomnia but that is not new. She feels pressure on her bladder at night, she took peridium and it helped. I recommended she have her urine tested, she went to her pcp and he tested her urine, she had these symptoms prior to starting the medication and has been working with her pcp. She bought a bike for exercise. Sheis exercising daily for 30 minutes. She is also going to start boxing. She feels she needs an increase to her neupro, will increase to 36m. She has tingling in the leg with radicular appointments. Asked  her to fu with stern, I don;t have access to the mr lumbar spine. Will repeat CT of the head to look at the focus in the brain in 6 months. Discussed PFO, follow with primary care.  Discussed her use of Ambien, to reports have shown association with Parkinson's disease, at this point will change to clonazepam which is also used for REM sleep disorder.  Addendum 06/03/2018: DAT Scan: IMPRESSION:Loss of dopamine transport activity in the bilateral striata is a pattern typical of Parkinson's syndrome pathology  Addendum 05/20/2018: Patient with reported low back pain with radiculopathy of right leg, right arm and leg weakness and sensory changes, diffuse pain. Reviewed workup with patient today. EMG/NCS within normal limits. She still has episodes of leg tremoring but she is feeling better and if she tells herself to stop she can stop it. She also still feels symptomatic in her right arm and right leg. But symptoms happen separately so may not be related. She went to physical therapy and she loves it. She is going to start Pilates. Reviewed MRI of the brain and thoracic imaging and labwork to date with patient which is extensive (see below). [facility] Her worst symptoms are the tingling and numbness in the prox arm and legs and the tremor. She asks me for a refill of Ambien, I will give her one refill of ambien but I do not agree with long-term use so she will have to  see her primary care to discuss. Needs a referral to a sleep counselor.  Patient needs cognitive behavioral therapy for insomnia. Please refer to [facility]  MRI brain: This MRI of the brain with and without contrast shows the following:  1.   Brain parenchyma appears normal before and after contrast. 2.   7 to 8 mm focus in the right skull.  Surrounding skull appears normal.   This appears to be benign. She should repeat CT head in 6 months to a year to follow  right skull focus, explained she should review with pcp and ask her to order as she may not need follow up in neurology. At this time she is looking for a new pcp and understands it is her responsibility to follow up on this. MRI Thoracic Spine: This MRI of the thoracic spine with and without contrast shows the following: 1.   The spinal cord appears normal. 2.   Small disc protrusions at T7-T8, T8-T9 and T10-T11 that do not cause nerve root compression or spinal stenosis. 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 Showed a small pfo. Emailed Dr. Rayann Heman, no intervention needed.  Parkinsonism?: Pending DAT scan  Normal labs: Vit D, MMA, Lyme, tsh, HIV, esr, sjogrens, pan-anca, b12, folate, rpr, help c, paraneoplatic abs, celiac antibodies, heavy metals, b6, IFE/SPEP, copper, b1, cmo, cbc, ana   HPI:  Caitlin Braun is a 63 y.o. female here as requested by Dr. Erline Levine for low back pain.  She has been seen by [place] [facility] and EMG nerve conduction study of the right upper extremity did not show any etiology for her symptoms. She has been extensively evaluated at [facility] with MRI c-spine and MRI L-spine and emg/ncs of the right arm. She has seen rheumatology. Here with her husband who provides much information. About 2 years she started feeling tired and lethargic, she was feeling joint pain diffuse. Then she started having right thumb pain and saw rheumatologist who allayed her fears about rheumatologic disease, diagnosed with hyper joints. She has had neck issues under her should blade on her right side and tingles and radiates down the right arm to the thumb. Massage and heat helps. She has some tremor in the right arm. Also when she sits on the toilet her right leg tremors. Also her handwriting is affected. She has weakness in the right hand, her hand goes numb, she can hardly write. She has weakness in the right hand. No problems with smell or taste, but he appetite has decreased and  taste has changed per husband. No drooling or wet pillows at night. She has imbalance. No REM sleep disorder. No falls. She denies incontinence, She mumbles a lot this is not new but husband has hearing difficulties, no hypophonia. Husband has noticed some shuffling. No changes in facial expressions. She has back pain and radiation into the right leg. Numbness in the right leg. No other focal neurologic deficits, associated symptoms, inciting events or modifiable factors.  Reviewed notes, labs and imaging from outside physicians, which showed:  Reviewed notes from Dr. Vertell Limber in his office neurosurgery and spine.:  Patient has been experiencing right upper extremity neck pain and also numbness and tingling in digits 1 and 2.  Diminished dexterity in the right hand.  Past medical history is otherwise noncontributory.  She has right shoulder right arm and right some numbness and tingling.  Numbness is exacerbated by use.  She denies any neck pain.  She also notes some right  leg numbness and tingling that begins in her right head and extends into her right outer toes.  She is noticed balance issues and handwriting issues over the last several months.  She also reports tremors in her right hand.  Her rheumatologist diagnosed hyperflexible right thumb and elevate her concern for autoimmune process.  On exam her neurologic exam is significant for decreased sensory in the right C6 distribution, Spurling's positive on the right, Tinel's positive on the right, Phalen's positive on the right.  She does have 4 out of 5 right hand intrinsics weakness and decreased pin sensitivity in the first and second digits on the right otherwise negative straight leg raise negative right sciatic notch discomfort.  Reviewed MRI of the cervical spine report I do not have imaging.  Impression is L4-L5 bilateral facet Orth osteoarthritis allowing 2 mm of anterolisthesis.  Mild bulging of the disc.  No compressive stenosis, the back  arthritis would be a cause of back pain or referred pain.  Chronic appearing degenerative changes of the discs at L3-L4 and L5-S1 but without herniation.  No stenosis or neural compression.  Exam date January 27, 2018.  MRI of the cervical spine reviewed report I do not have the images available: No significant degenerative changes, no stenosis or neural compression, no abnormality seen to explain neck pain and right arm symptoms.  T2 bright focus within the anterior right T1 vertebral body which may be a benign cyst would recommend, suggest a contrast-enhanced examination to rule out enhancing marrow space lesion.  Labs include normal thyroid panel, BUN 16 and creatinine 0.58 collected 03/25/2017, ANA negative, rheumatoid arthritis negative, sed rate negative, CEA normal, CA normal CBC was unremarkable April 11, 2017 except for mildly low platelets at 136.  Reviewed data and results of electrodiagnostic testing summary includes right upper extremity was within normal limits with no evidence for cervical radiculopathy, brachial plexopathy, peripheral median or ulnar neuropathy as well as polyneuropathy.  Per the note she could be experiencing some cervical radiculitis is not manifesting on electrodiagnostic testing, recommended MRI cervical spine.  Also recommended possibly repeating in the future.  Due to right arm numbness right arm weakness and right arm pain.     Review of Systems: Patient complains of symptoms per HPI as well as the following symptoms: numbness, weakness, insomnia, leg pain, tremor, aching muscles . Pertinent negatives and positives per HPI. All others negative.   Social History   Socioeconomic History   Marital status: Married    Spouse name: Not on file   Number of children: 3   Years of education: Not on file   Highest education level: Bachelor's degree (e.g., BA, AB, BS)  Occupational History   Not on file  Social Needs   Financial resource strain: Not on  file   Food insecurity:    Worry: Not on file    Inability: Not on file   Transportation needs:    Medical: Not on file    Non-medical: Not on file  Tobacco Use   Smoking status: Never Smoker   Smokeless tobacco: Never Used  Substance and Sexual Activity   Alcohol use: Yes    Alcohol/week: 3.0 standard drinks    Types: 3 Standard drinks or equivalent per week   Drug use: No   Sexual activity: Not on file  Lifestyle   Physical activity:    Days per week: Not on file    Minutes per session: Not on file   Stress: Not on file  Relationships   Social connections:    Talks on phone: Not on file    Gets together: Not on file    Attends religious service: Not on file    Active member of club or organization: Not on file    Attends meetings of clubs or organizations: Not on file    Relationship status: Not on file   Intimate partner violence:    Fear of current or ex partner: Not on file    Emotionally abused: Not on file    Physically abused: Not on file    Forced sexual activity: Not on file  Other Topics Concern   Not on file  Social History Narrative   Lives at home with husband    Family History  Problem Relation Age of Onset   Cancer Mother    Cancer Father    Cancer Brother    Breast cancer Neg Hx    Parkinson's disease Neg Hx     Past Medical History:  Diagnosis Date   GAD (generalized anxiety disorder)    Osteoporosis     Past Surgical History:  Procedure Laterality Date   ABDOMINAL HYSTERECTOMY     AUGMENTATION MAMMAPLASTY Bilateral 2016, 1998   Patient had them redone in 2016   COSMETIC SURGERY     face lift     NOSE SURGERY     cosmetic    Current Outpatient Medications  Medication Sig Dispense Refill   ALPRAZolam (XANAX) 0.25 MG tablet Take 0.25 mg by mouth as needed.      Biotin w/ Vitamins C & E (HAIR/SKIN/NAILS PO) Take by mouth.     Calcium Carb-Cholecalciferol (CALCIUM + D3 PO) Take 500 mg by mouth daily.      Cholecalciferol (VITAMIN D3 PO) Take 2,000 Int'l Units by mouth daily.     clonazePAM (KLONOPIN) 1 MG tablet Take 1 tablet (1 mg total) by mouth at bedtime. 30 tablet 4   Fexofenadine-Pseudoephedrine (ALLEGRA-D 12 HOUR PO) Take 1 tablet by mouth daily.      FLUTICASONE PROPIONATE NA Place 1 spray into both nostrils at bedtime.     ketotifen (ZADITOR) 0.025 % ophthalmic solution Place 1 drop into both eyes as needed.     KRILL OIL PO Take 500 mg by mouth daily.     MAGNESIUM PO Take by mouth at bedtime.     Minoxidil (ROGAINE EX) Apply 2 drops topically daily.     montelukast (SINGULAIR) 10 MG tablet Take 10 mg by mouth at bedtime.     Multiple Vitamin (MULTIVITAMIN) tablet Take 1 tablet by mouth daily.     Potassium Gluconate 550 MG TABS Take 1 tablet by mouth daily.     Probiotic Product (PROBIOTIC PO) Take by mouth.     Rotigotine (NEUPRO) 1 MG/24HR PT24 Place 1 patch (1 mg total) onto the skin daily. Use with 61m patch for a total of 322m 28 patch 0   rotigotine (NEUPRO) 2 MG/24HR Place one patch (remove old) onto the skin daily. 30 patch 1   rotigotine (NEUPRO) 4 MG/24HR Place 1 patch onto the skin daily. Use one patch daily as instructed. If needed, may try a 44m56match instead of 3mg85mmg 25mmg).244m patch 0   Wheat Dextrin (BENEFIBER PO) Take by mouth.     No current facility-administered medications for this visit.     Allergies as of 08/25/2018 - Review Complete 08/19/2018  Allergen Reaction Noted   Augmentin [amoxicillin-pot clavulanate] Hives 03/02/2018  Perflutren lipid microsphere Other (See Comments) 03/24/2018    Vitals: There were no vitals taken for this visit. Last Weight:  Wt Readings from Last 1 Encounters:  06/21/18 123 lb (55.8 kg)   Last Height:   Ht Readings from Last 1 Encounters:  06/21/18 5' 10.5" (1.791 m)   Physical exam: Exam: Gen: NAD, conversant, well nourised, thin,, well groomed                     CV: RRR, +SEM. No Carotid  Bruits. No peripheral edema, warm, nontender Eyes: Conjunctivae clear without exudates or hemorrhage MSK: tall, thin, thin and long fingers, joint laxity  Neuro: Detailed Neurologic Exam  Speech:    Speech is normal; fluent and spontaneous with normal comprehension.  Cognition:    The patient is oriented to person, place, and time;     recent and remote memory intact;     language fluent;     normal attention, concentration,     fund of knowledge Cranial Nerves: Hypomimia    The pupils are equal, round, and reactive to light. The fundi are normal and spontaneous venous pulsations are present. Visual fields are full to finger confrontation. Extraocular movements are intact. Trigeminal sensation is intact and the muscles of mastication are normal. Bilateral ptosis. The palate elevates in the midline. Hearing intact. Voice is normal. Shoulder shrug is normal. The tongue has normal motion without fasciculations.   Coordination:    Normal finger to nose and heel to shin. Normal rapid alternating movements.   Gait:    Heel-toe and tandem gait are normal.  Decreased arm swing.   Motor Observation:    Postural tremor Tone:    Normal muscle tone.   Posture:    Posture is normal. normal erect    Strength: Mild generalized weakness more proximaly 4+/5 in the deltoids and hip flexors    Strength is V/V in the upper and lower limbs.      Sensation: intact to LT     Reflex Exam:  DTR's: right biceps slightly increased as compared to the left.  Brisk lowers for age.     Toes:    The toes are downgoing bilaterally.   Clonus:    2 beats clonus at AJs        Assessment/Plan:  63 year old female with multiple symptoms including chronic neck and back pain, radicular symptoms, fatigue, lethargy, diffuse pain, hyer joints, neck pain muscular, tremors, worsening weakness in right arm and hand and numbness, imbalance, hypomimia, decreased arm swing, numbnes sin right leg. She has already  been extensively evaluated without causes found.  Extensive evaluation including MRI of the brain, MRI of the cervical spine, DaTscan revealed Parkinson's disease confirmed by Dr. Wells Guiles Tat who is an expert in this field.  - CT scan of the head with/w ocntrast to follow up on the skull lesion seen on MRI head. - Doing well on dopamine agonist, increase to 50m neupro - 4 months in the office for follow-up, 1 hour - Clonazepam - Discussed her use of Ambien, to reports have shown association with Parkinson's disease, at this point will change to clonazepam which is also used for REM sleep disorder. L- umbar radiculopathy: Please refer to Dr. HMaryjean Ka  Patient has lumbar radiculopathy, has seen Dr. SVertell Limberin the past for this, requesting epidural steroid injections or evaluation and treatment as clinically warranted with pain procedures.  Orders Placed This Encounter  Procedures   CT HEAD W &  [facility] [place]   Ambulatory referral to Neurosurgery     Addendum 06/03/2018: DAT Scan: IMPRESSION:Loss of dopamine transport activity in the bilateral striata is a pattern typical of Parkinson's syndrome pathology  Addendum 05/20/2018: Patient with reported low back pain with radiculopathy of right leg, right arm and leg weakness and sensory changes, diffuse pain. Reviewed workup with patient today. EMG/NCS within normal limits. She still has episodes of leg tremoring but she is feeling better and if she tells herself to stop she can stop it. She also still feels symptomatic in her right arm and right leg. But symptoms happen separately so may not be related. She went to physical therapy and she loves it. She is going to start Pilates. Reviewed MRI of the brain and thoracic imaging and labwork to date with patient which is extensive (see below). She has already has MRI cervical spine and lumbar spine at [facility]. Her worst symptoms are the tingling and numbness in the prox arm and legs and the tremor.  She asks me for a refill of Ambien, I will give her one refill of ambien but I do not agree with long-term use so she will have to see her primary care to discuss. Needs a referral to a sleep counselor.  Patient needs cognitive behavioral therapy for insomnia. Please refer to [facility]  MRI brain: This MRI of the brain with and without contrast shows the following:  1.   Brain parenchyma appears normal before and after contrast. 2.   7 to 8 mm focus in the right skull.  Surrounding skull appears normal.   This appears to be benign. She should repeat CT head in 6 months to a year to follow right skull focus, explained she should review with pcp and ask her to order as she may not need follow up in neurology. At this time she is looking for a new pcp and understands it is her responsibility to follow up on this. MRI Thoracic Spine: This MRI of the thoracic spine with and without contrast shows the following: 1.   The spinal cord appears normal. 2.   Small disc protrusions at T7-T8, T8-T9 and T10-T11 that do not cause nerve root compression or spinal stenosis. 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 Showed a small pfo. Emailed Dr. Rayann Heman, no intervention needed.  Normal labs: Vit D, MMA, Lyme, tsh, HIV, esr, sjogrens, pan-anca, b12, folate, rpr, help c, paraneoplatic abs, celiac antibodies, heavy metals, b6, IFE/SPEP, copper, b1, cmo, cbc, ana  Initial Assessment and plan 03/02/2018:  - Need to evaluate for MS given diffuse symptoms. MRI brain and cervical spine w/wo contrast - MRI of the brain to evaluate for MS, stroke, masses or other lesions causing her focal neurologic symptoms. Also MRI cervical and thoracic spine w/wo contrast to evaluate for  T2 bright focus within the anterior right T1 vertebral body which may be a benign cyst but suggest a contrast-enhanced examination to rule out enhancing marrow space lesion or other lesions. - Right rhomboid and trap tightness, heat  and massage feels good. Dry needling for cervical myofascial pain - EMG/NCS of the right arm and right leg - Phenotypically she appears Marfan-like?: echocardiogram and f/u with pcp to discuss referral if clinically warranted and possibly genetic testing - Consider DAT scan, parkinsonism on exam - Tremor: may be essential tremor? MRI brain and cervical spine - Extensive lab testing today - Physical therapy for right-sided and pain and gait abnormality and proximal weakness: Mild generalized  weakness more proximaly 4+/5 in the deltoids and hip flexors - A wrist splint to support right wrist and right thumb. - Xray of right hand  Orders Placed This Encounter  Procedures   [place]   Ambulatory referral to Neurosurgery    Cc: [name], MD, Dr. Erline Levine  A total of 120 minutes was spent face-to-face with this patient. Over half this time was spent on counseling patient on the  1. Parkinson's disease ([place])   2. Skull lesion   3. Skull anomaly   4. Lumbar radiculopathy   5. Bilateral low back pain with sciatica, sciatica laterality unspecified, unspecified chronicity   6. REM sleep behavior disorder   7. Hyperostosis of skull     diagnosis and different diagnostic and therapeutic options, counseling and coordination of care, risks ans benefits of management, compliance, or risk factor reduction and education.      Sarina Ill, MD  [facility] [address] [place], [place] [address]  Phone [phone] Fax [phone]

## 2018-08-31 ENCOUNTER — Telehealth: Payer: Self-pay | Admitting: Neurology

## 2018-08-31 NOTE — Telephone Encounter (Signed)
[employer] pending faxed notes 

## 2018-09-01 ENCOUNTER — Other Ambulatory Visit: Payer: Self-pay | Admitting: Obstetrics and Gynecology

## 2018-09-01 DIAGNOSIS — Z1231 Encounter for screening mammogram for malignant neoplasm of breast: Secondary | ICD-10-CM

## 2018-09-02 NOTE — Telephone Encounter (Signed)
[employer] Auth: [id number] (exp. 09/01/18 to 10/16/18) order sent to GI. They will reach out to the patient to schedule.

## 2018-09-06 ENCOUNTER — Other Ambulatory Visit: Payer: Self-pay | Admitting: Neurology

## 2018-09-06 MED ORDER — ROTIGOTINE 2 MG/24HR TD PT24: MEDICATED_PATCH | TRANSDERMAL | 4 refills | Status: DC

## 2018-09-06 MED ORDER — ROTIGOTINE 1 MG/24HR TD PT24: 1.0000 mg | MEDICATED_PATCH | Freq: Every day | TRANSDERMAL | 4 refills | Status: DC

## 2018-09-10 ENCOUNTER — Other Ambulatory Visit: Payer: Self-pay

## 2018-09-10 ENCOUNTER — Ambulatory Visit
Admission: RE | Admit: 2018-09-10 | Discharge: 2018-09-10 | Disposition: A | Discharge status: Home / Self Care | Payer: 59 | Source: Ambulatory Visit | Attending: Neurology | Admitting: Neurology

## 2018-09-10 DIAGNOSIS — M899 Disorder of bone, unspecified: Secondary | ICD-10-CM

## 2018-09-10 DIAGNOSIS — M852 Hyperostosis of skull: Secondary | ICD-10-CM | POA: Diagnosis not present

## 2018-09-10 DIAGNOSIS — Q759 Congenital malformation of skull and face bones, unspecified: Secondary | ICD-10-CM

## 2018-09-10 MED ORDER — IOPAMIDOL (ISOVUE-300) INJECTION 61%
75.0000 mL | Freq: Once | INTRAVENOUS | Status: AC | PRN
  Administered 2018-09-10: 75 mL via INTRAVENOUS

## 2018-09-13 ENCOUNTER — Other Ambulatory Visit: Payer: Self-pay | Admitting: Neurology

## 2018-09-13 ENCOUNTER — Telehealth: Payer: Self-pay | Admitting: Neurology

## 2018-09-13 NOTE — Telephone Encounter (Signed)
Requested Prescriptions   Pending Prescriptions Disp Refills  . pramipexole (MIRAPEX) 0.5 MG tablet [Pharmacy Med Name: PRAMIPEXOLE 0.5 MG TABLET] 270 tablet 0    Sig: TAKE 1 TABLET (0.5 MG TOTAL) BY MOUTH 3 (THREE) TIMES DAILY.   Rx last filled: 06/21/18 #90 2 refills  Pt last seen:06/21/18   -We decided to add pramipexole and work to 0.5 mg tid.  We discussed risks, benefits, and side effects, which included but were not limited to sleep attacks and compulsive behaviors.  Understanding was expressed. Follow up appt scheduled:none

## 2018-09-13 NOTE — Telephone Encounter (Signed)
Called patient and LVM to schedule a 4 month follow-up per Dr. Jaynee Eagles. Gave office number for reference.

## 2018-10-18 ENCOUNTER — Other Ambulatory Visit: Payer: Self-pay

## 2018-10-18 ENCOUNTER — Ambulatory Visit
Admission: RE | Admit: 2018-10-18 | Discharge: 2018-10-18 | Disposition: A | Discharge status: Home / Self Care | Payer: 59 | Source: Ambulatory Visit | Attending: Obstetrics and Gynecology | Admitting: Obstetrics and Gynecology

## 2018-10-18 DIAGNOSIS — Z1231 Encounter for screening mammogram for malignant neoplasm of breast: Secondary | ICD-10-CM

## 2018-10-19 ENCOUNTER — Other Ambulatory Visit: Payer: Self-pay | Admitting: Obstetrics and Gynecology

## 2018-10-19 DIAGNOSIS — R921 Mammographic calcification found on diagnostic imaging of breast: Secondary | ICD-10-CM

## 2018-10-22 ENCOUNTER — Ambulatory Visit
Admission: RE | Admit: 2018-10-22 | Discharge: 2018-10-22 | Disposition: A | Discharge status: Home / Self Care | Payer: 59 | Source: Ambulatory Visit | Attending: Obstetrics and Gynecology | Admitting: Obstetrics and Gynecology

## 2018-10-22 ENCOUNTER — Other Ambulatory Visit: Payer: Self-pay | Admitting: Obstetrics and Gynecology

## 2018-10-22 ENCOUNTER — Other Ambulatory Visit: Payer: Self-pay

## 2018-10-22 DIAGNOSIS — R921 Mammographic calcification found on diagnostic imaging of breast: Secondary | ICD-10-CM

## 2018-11-03 ENCOUNTER — Encounter (HOSPITAL_BASED_OUTPATIENT_CLINIC_OR_DEPARTMENT_OTHER): Payer: Self-pay

## 2018-11-03 ENCOUNTER — Emergency Department (HOSPITAL_BASED_OUTPATIENT_CLINIC_OR_DEPARTMENT_OTHER): Payer: 59

## 2018-11-03 ENCOUNTER — Other Ambulatory Visit: Payer: Self-pay

## 2018-11-03 ENCOUNTER — Emergency Department (HOSPITAL_BASED_OUTPATIENT_CLINIC_OR_DEPARTMENT_OTHER)
Admission: EM | Admit: 2018-11-03 | Discharge: 2018-11-03 | Disposition: A | Discharge status: Home / Self Care | Payer: 59 | Attending: Emergency Medicine | Admitting: Emergency Medicine

## 2018-11-03 DIAGNOSIS — R109 Unspecified abdominal pain: Secondary | ICD-10-CM

## 2018-11-03 DIAGNOSIS — R1010 Upper abdominal pain, unspecified: Secondary | ICD-10-CM | POA: Insufficient documentation

## 2018-11-03 DIAGNOSIS — R1011 Right upper quadrant pain: Secondary | ICD-10-CM | POA: Insufficient documentation

## 2018-11-03 DIAGNOSIS — Z79899 Other long term (current) drug therapy: Secondary | ICD-10-CM | POA: Diagnosis not present

## 2018-11-03 DIAGNOSIS — G20 Parkinson's disease: Secondary | ICD-10-CM | POA: Diagnosis not present

## 2018-11-03 HISTORY — DX: Parkinson's disease: G20

## 2018-11-03 LAB — CBC WITH DIFFERENTIAL/PLATELET
Abs Immature Granulocytes: 0.03 10*3/uL (ref 0.00–0.07)
Basophils Absolute: 0 10*3/uL (ref 0.0–0.1)
Basophils Relative: 0 %
Eosinophils Absolute: 0 10*3/uL (ref 0.0–0.5)
Eosinophils Relative: 0 %
HCT: 37.7 % (ref 36.0–46.0)
Hemoglobin: 11.9 g/dL — ABNORMAL LOW (ref 12.0–15.0)
Immature Granulocytes: 0 %
Lymphocytes Relative: 8 %
Lymphs Abs: 0.7 10*3/uL (ref 0.7–4.0)
MCH: 29 pg (ref 26.0–34.0)
MCHC: 31.6 g/dL (ref 30.0–36.0)
MCV: 92 fL (ref 80.0–100.0)
Monocytes Absolute: 0.2 10*3/uL (ref 0.1–1.0)
Monocytes Relative: 2 %
Neutro Abs: 7.7 10*3/uL (ref 1.7–7.7)
Neutrophils Relative %: 90 %
Platelets: 124 10*3/uL — ABNORMAL LOW (ref 150–400)
RBC: 4.1 MIL/uL (ref 3.87–5.11)
RDW: 13.2 % (ref 11.5–15.5)
WBC: 8.7 10*3/uL (ref 4.0–10.5)
nRBC: 0 % (ref 0.0–0.2)

## 2018-11-03 LAB — COMPREHENSIVE METABOLIC PANEL
ALT: 17 U/L (ref 0–44)
AST: 22 U/L (ref 15–41)
Albumin: 5 g/dL (ref 3.5–5.0)
Alkaline Phosphatase: 54 U/L (ref 38–126)
Anion gap: 11 (ref 5–15)
BUN: 14 mg/dL (ref 8–23)
CO2: 23 mmol/L (ref 22–32)
Calcium: 9.4 mg/dL (ref 8.9–10.3)
Chloride: 99 mmol/L (ref 98–111)
Creatinine, Ser: 0.38 mg/dL — ABNORMAL LOW (ref 0.44–1.00)
GFR calc Af Amer: >60 mL/min (ref >60)
GFR calc non Af Amer: >60 mL/min (ref >60)
Glucose, Bld: 139 mg/dL — ABNORMAL HIGH (ref 70–99)
Potassium: 3.9 mmol/L (ref 3.5–5.1)
Sodium: 133 mmol/L — ABNORMAL LOW (ref 135–145)
Total Bilirubin: 1.2 mg/dL (ref 0.3–1.2)
Total Protein: 7.8 g/dL (ref 6.5–8.1)

## 2018-11-03 LAB — URINALYSIS, ROUTINE W REFLEX MICROSCOPIC
Bilirubin Urine: NEGATIVE
Glucose, UA: NEGATIVE mg/dL
Ketones, ur: 15 mg/dL — AB
Leukocytes,Ua: NEGATIVE
Nitrite: NEGATIVE
Protein, ur: NEGATIVE mg/dL
Specific Gravity, Urine: 1.015 (ref 1.005–1.030)
pH: 7 (ref 5.0–8.0)

## 2018-11-03 LAB — URINALYSIS, MICROSCOPIC (REFLEX)

## 2018-11-03 LAB — LIPASE, BLOOD: Lipase: 33 U/L (ref 11–51)

## 2018-11-03 MED ORDER — IOHEXOL 300 MG/ML  SOLN
100.0000 mL | Freq: Once | INTRAMUSCULAR | Status: AC | PRN
  Administered 2018-11-03: 100 mL via INTRAVENOUS

## 2018-11-03 MED ORDER — ALUM & MAG HYDROXIDE-SIMETH 200-200-20 MG/5ML PO SUSP
30.0000 mL | Freq: Once | ORAL | Status: AC
  Administered 2018-11-03: 30 mL via ORAL
  Filled 2018-11-03: qty 30

## 2018-11-03 MED ORDER — CEPHALEXIN 500 MG PO CAPS: 500.0000 mg | ORAL_CAPSULE | Freq: Four times a day (QID) | ORAL | 0 refills | Status: AC

## 2018-11-03 MED ORDER — MORPHINE SULFATE (PF) 4 MG/ML IV SOLN
4.0000 mg | Freq: Once | INTRAVENOUS | Status: AC
  Administered 2018-11-03: 4 mg via INTRAVENOUS
  Filled 2018-11-03: qty 1

## 2018-11-03 MED ORDER — CEPHALEXIN 500 MG PO CAPS: 500.0000 mg | ORAL_CAPSULE | Freq: Four times a day (QID) | ORAL | 0 refills | Status: DC

## 2018-11-03 MED ORDER — HYDROMORPHONE HCL 1 MG/ML IJ SOLN
1.0000 mg | Freq: Once | INTRAMUSCULAR | Status: AC
  Administered 2018-11-03: 1 mg via INTRAVENOUS
  Filled 2018-11-03: qty 1

## 2018-11-03 MED ORDER — LIDOCAINE VISCOUS HCL 2 % MT SOLN
15.0000 mL | Freq: Once | OROMUCOSAL | Status: AC
  Administered 2018-11-03: 15 mL via ORAL
  Filled 2018-11-03: qty 15

## 2018-11-03 MED ORDER — ONDANSETRON 4 MG PO TBDP: 4.0000 mg | ORAL_TABLET | Freq: Three times a day (TID) | ORAL | 0 refills | Status: DC | PRN

## 2018-11-03 MED ORDER — CEPHALEXIN 250 MG PO CAPS
500.0000 mg | ORAL_CAPSULE | Freq: Once | ORAL | Status: AC
  Administered 2018-11-03: 500 mg via ORAL
  Filled 2018-11-03: qty 2

## 2018-11-03 MED ORDER — ONDANSETRON HCL 4 MG/2ML IJ SOLN
4.0000 mg | Freq: Once | INTRAMUSCULAR | Status: AC
  Administered 2018-11-03: 4 mg via INTRAVENOUS
  Filled 2018-11-03: qty 2

## 2018-11-03 MED ORDER — HYDROCODONE-ACETAMINOPHEN 5-325 MG PO TABS: 1.0000 | ORAL_TABLET | Freq: Four times a day (QID) | ORAL | 0 refills | Status: DC | PRN

## 2018-11-03 MED ORDER — HYDROCODONE-ACETAMINOPHEN 5-325 MG PO TABS
1.0000 | ORAL_TABLET | Freq: Once | ORAL | Status: AC
  Administered 2018-11-03: 1 via ORAL
  Filled 2018-11-03: qty 1

## 2018-11-03 MED ORDER — SODIUM CHLORIDE 0.9 % IV BOLUS
1000.0000 mL | Freq: Once | INTRAVENOUS | Status: AC
  Administered 2018-11-03: 1000 mL via INTRAVENOUS

## 2018-11-03 NOTE — Discharge Instructions (Signed)
Take pain medications as directed for break through pain. Do not drive or operate machinery while taking this medication.   Take antibiotics as directed. Please take all of your antibiotics until finished.  Take Zofran as directed for nausea.  Closely monitor your symptoms.  If you have any worsening pain, vomiting, fever, nausea, unable to eat or drink, return emergency department immediately.

## 2018-11-03 NOTE — ED Triage Notes (Signed)
Pt c/o bilat flank pain x 2 weeks-worse since 2am-hematuria at [facility]-NAD-slow gait

## 2018-11-03 NOTE — ED Provider Notes (Signed)
[facility] Provider Note   CSN: [id number] Arrival date & time: 11/03/18  1504    History   Chief Complaint Chief Complaint  Patient presents with   Flank Pain    HPI Caitlin Braun is a 63 y.o. female past medical history is of Parkinson disease, generalized anxiety disorder who presents for evaluation of abdominal pain that began this morning at about 2 AM.  She reports that about 2 AM, she started experiencing some upper abdomen.  She states that for the last few days, she has had some mild upper abdominal pain.  She reports that over the course the next few hours, the pain started becoming more severe and states that it was radiating from her bilateral flank to the upper and midportion of her abdomen.  She has not taken any medications for the pain.  She states that pain is progressively gotten worse.  She reports some associated nausea but does not any vomiting.  She has had some anorexia.  She states that she initially went to urgent care and was advised to come to the ED for further evaluation.  She does report that she had her urine tested at urgent care and they noted some hematuria but she does not visualize any hematuria.  She denies any dysuria.  She states that she has not noted any fevers.  She states she has not had any chest pain or difficulty breathing.  No recent travel.  No known, 19 exposure.  Patient states that she has no history of peptic ulcer disease.  She is not a current smoker.  She endorses about 4 to 5 glasses of wine per week.  She is not currently on anti-inflammatories.      The history is provided by the patient.    Past Medical History:  Diagnosis Date   GAD (generalized anxiety disorder)    Osteoporosis    Parkinson disease ([place])     Patient Active Problem List   Diagnosis Date Noted   Parkinson's disease ([place]) 06/21/2018   Numbness and tingling of right arm and leg 03/02/2018   Muscle weakness (generalized)  03/02/2018   Tremors of nervous system 03/02/2018   Deformity of right thumb joint 03/02/2018   Chronic pain of right thumb 03/02/2018   Chronic neck pain 03/02/2018   Bilateral low back pain with sciatica 03/02/2018   Myofascial pain syndrome, cervical 03/02/2018    Past Surgical History:  Procedure Laterality Date   ABDOMINAL HYSTERECTOMY     AUGMENTATION MAMMAPLASTY Bilateral 2016, 1998   Patient had them redone in 2016   COSMETIC SURGERY     face lift     NOSE SURGERY     cosmetic     OB History   No obstetric history on file.      Home Medications    Prior to Admission medications   Medication Sig Start Date End Date Taking? Authorizing Provider  ALPRAZolam (XANAX) 0.25 MG tablet Take 0.25 mg by mouth as needed.     [provider]  Biotin w/ Vitamins C & E (HAIR/SKIN/NAILS PO) Take by mouth.    [provider]  Calcium Carb-Cholecalciferol (CALCIUM + D3 PO) Take 500 mg by mouth daily.    [provider]  cephALEXin (KEFLEX) 500 MG capsule Take 1 capsule (500 mg total) by mouth 4 (four) times daily for 7 days. 11/03/18 11/10/18  Volanda Napoleon, PA-C  Cholecalciferol (VITAMIN D3 PO) Take 2,000 Int'l Units by mouth  daily.    [provider]  clonazePAM (KLONOPIN) 1 MG tablet Take 1 tablet (1 mg total) by mouth at bedtime. 08/25/18   Melvenia Beam, MD  Fexofenadine-Pseudoephedrine (ALLEGRA-D 12 HOUR PO) Take 1 tablet by mouth daily.     [provider]  FLUTICASONE PROPIONATE NA Place 1 spray into both nostrils at bedtime.    [provider]  HYDROcodone-acetaminophen (NORCO/VICODIN) 5-325 MG tablet Take 1-2 tablets by mouth every 6 (six) hours as needed. 11/03/18   Volanda Napoleon, PA-C  ketotifen (ZADITOR) 0.025 % ophthalmic solution Place 1 drop into both eyes as needed.    [provider]  KRILL OIL PO Take 500 mg by mouth daily.    [provider]  MAGNESIUM PO Take by mouth at  bedtime.    [provider]  Minoxidil (ROGAINE EX) Apply 2 drops topically daily.    [provider]  montelukast (SINGULAIR) 10 MG tablet Take 10 mg by mouth at bedtime.    [provider]  Multiple Vitamin (MULTIVITAMIN) tablet Take 1 tablet by mouth daily.    [provider]  ondansetron (ZOFRAN ODT) 4 MG disintegrating tablet Take 1 tablet (4 mg total) by mouth every 8 (eight) hours as needed for nausea or vomiting. 11/03/18   Volanda Napoleon, PA-C  Potassium Gluconate 550 MG TABS Take 1 tablet by mouth daily.    [provider]  pramipexole (MIRAPEX) 0.5 MG tablet TAKE 1 TABLET (0.5 MG TOTAL) BY MOUTH 3 (THREE) TIMES DAILY. 09/13/18   Tat, Eustace Quail, DO  Probiotic Product (PROBIOTIC PO) Take by mouth.    [provider]  Rotigotine (NEUPRO) 1 MG/24HR PT24 Place 1 patch (1 mg total) onto the skin daily. Use with 2mg  patch for a total of 3mg . 09/06/18   Melvenia Beam, MD  rotigotine (NEUPRO) 2 MG/24HR Place one patch (remove old) onto the skin daily. Use with 1mg  patch for a total of 3mg  daily. 09/06/18   Melvenia Beam, MD  Wheat Dextrin (BENEFIBER PO) Take by mouth.    [provider]    Family History Family History  Problem Relation Age of Onset   Cancer Mother    Cancer Father    Cancer Brother    Breast cancer Neg Hx    Parkinson's disease Neg Hx     Social History Social History   Tobacco Use   Smoking status: Never Smoker   Smokeless tobacco: Never Used  Substance Use Topics   Alcohol use: Yes    Alcohol/week: 3.0 standard drinks    Types: 3 Standard drinks or equivalent per week   Drug use: No     Allergies   Augmentin [amoxicillin-pot clavulanate] and Perflutren lipid microsphere   Review of Systems Review of Systems  Constitutional: Positive for appetite change. Negative for fever.  Respiratory: Negative for cough and shortness of breath.   Cardiovascular: Negative for chest pain.    Gastrointestinal: Positive for abdominal pain and nausea. Negative for vomiting.  Genitourinary: Positive for hematuria. Negative for dysuria.  Neurological: Negative for headaches.  All other systems reviewed and are negative.    Physical Exam Updated Vital Signs BP 113/77 (BP Location: Right Arm)    Pulse 75    Temp 98.6 F (37 C) (Oral)    Resp 18    Ht 5\' 10"  (1.778 m)    Wt 55.3 kg    SpO2 100%    BMI 17.51 kg/m   Physical  Exam Vitals signs and nursing note reviewed.  Constitutional:      Appearance: Normal appearance. She is well-developed.     Comments: Appears uncomfortable  HENT:     Head: Normocephalic and atraumatic.  Eyes:     General: Lids are normal.     Conjunctiva/sclera: Conjunctivae normal.     Pupils: Pupils are equal, round, and reactive to light.  Neck:     Musculoskeletal: Full passive range of motion without pain.  Cardiovascular:     Rate and Rhythm: Normal rate and regular rhythm.     Pulses:          Radial pulses are 2+ on the right side and 2+ on the left side.       Dorsalis pedis pulses are 2+ on the right side and 2+ on the left side.     Heart sounds: Normal heart sounds. No murmur. No friction rub. No gallop.   Pulmonary:     Effort: Pulmonary effort is normal.     Breath sounds: Normal breath sounds.     Comments: Lungs clear to auscultation bilaterally.  Symmetric chest rise.  No wheezing, rales, rhonchi. Abdominal:     Palpations: Abdomen is soft. Abdomen is not rigid.     Tenderness: There is abdominal tenderness in the right upper quadrant, epigastric area and left upper quadrant. There is right CVA tenderness. There is no guarding.     Comments: Abdomen is soft, nondistended.  Tenderness noted diffusely to the upper abdomen into the periumbilical region.  CVA tenderness noted on the right but none on the left.  No rigidity, guarding.  Musculoskeletal: Normal range of motion.  Skin:    General: Skin is warm and dry.     Capillary  Refill: Capillary refill takes less than 2 seconds.  Neurological:     Mental Status: She is alert and oriented to person, place, and time.  Psychiatric:        Speech: Speech normal.      ED Treatments / Results  Labs (all labs ordered are listed, but only abnormal results are displayed) Labs Reviewed  URINALYSIS, ROUTINE W REFLEX MICROSCOPIC - Abnormal; Notable for the following components:      Result Value   Hgb urine dipstick MODERATE (*)    Ketones, ur 15 (*)    All other components within normal limits  COMPREHENSIVE METABOLIC PANEL - Abnormal; Notable for the following components:   Sodium 133 (*)    Glucose, Bld 139 (*)    Creatinine, Ser 0.38 (*)    All other components within normal limits  CBC WITH DIFFERENTIAL/PLATELET - Abnormal; Notable for the following components:   Hemoglobin 11.9 (*)    Platelets 124 (*)    All other components within normal limits  URINALYSIS, MICROSCOPIC (REFLEX) - Abnormal; Notable for the following components:   Bacteria, UA FEW (*)    All other components within normal limits  URINE CULTURE  LIPASE, BLOOD    EKG None  Radiology Ct Abdomen Pelvis W Contrast  Result Date: 11/03/2018 CLINICAL DATA:  Bilateral flank pain, hematuria EXAM: CT ABDOMEN AND PELVIS WITH CONTRAST TECHNIQUE: Multidetector CT imaging of the abdomen and pelvis was performed using the standard protocol following bolus administration of intravenous contrast. CONTRAST:  175mL OMNIPAQUE IOHEXOL 300 MG/ML  SOLN COMPARISON:  None. FINDINGS: Lower chest: No acute abnormality. Hepatobiliary: No solid liver abnormality is seen. Hepatomegaly, maximum coronal span 23 cm. No gallstones appreciated. There is mild gallbladder wall thickening  and/or pericholecystic fluid. There is intrahepatic biliary ductal dilatation and or periportal edema. The common bile duct is nondilated and tapers smoothly to the ampulla without obstructing lesion identified. Pancreas: Unremarkable. No  pancreatic ductal dilatation or surrounding inflammatory changes. Spleen: Normal in size without significant abnormality. Adrenals/Urinary Tract: Adrenal glands are unremarkable. There are subtle hypodensities of the renal parenchyma bilaterally, for example in the upper pole of the right kidney (e.g. Series 5, image 47). Bladder is unremarkable. Stomach/Bowel: Stomach is within normal limits. Appendix appears normal. No evidence of bowel wall thickening, distention, or inflammatory changes. Vascular/Lymphatic: No significant vascular findings are present. No enlarged abdominal or pelvic lymph nodes. Reproductive: Status post hysterectomy. Other: No abdominal wall hernia or abnormality. Small volume ascites in the low pelvis. Musculoskeletal: No acute or significant osseous findings. IMPRESSION: 1. There are subtle hypodensities of the renal parenchyma bilaterally, for example in the upper pole of the right kidney (e.g. Series 5, image 47). Findings are concerning for pyelonephritis. No urinary tract calculus or hydronephrosis. 2.  Hepatomegaly, maximum coronal span 23 cm. 3. There is mild gallbladder wall thickening and/or pericholecystic fluid. No gallstones appreciated. There is intrahepatic biliary ductal dilatation and/or periportal edema. The common bile duct is nondilated and tapers smoothly to the ampulla without obstructing lesion identified. Ultrasound may be helpful to further evaluate, as may be HIDA to evaluate for patency of the common bile duct if obstruction is suspected based on laboratory findings. 4.  Small volume ascites in the low pelvis, of uncertain etiology. Electronically Signed   By: Eddie Candle M.D.   On: 11/03/2018 18:21   US Abdomen Limited Ruq  Result Date: 11/03/2018 CLINICAL DATA:  Right upper quadrant pain for 16 hours EXAM: ULTRASOUND ABDOMEN LIMITED RIGHT UPPER QUADRANT COMPARISON:  None. FINDINGS: Gallbladder: No gallstones or wall thickening visualized. No sonographic Murphy  sign noted by sonographer. Common bile duct: Diameter: 2 mm Liver: No focal lesion identified. Within normal limits in parenchymal echogenicity. Portal vein is patent on color Doppler imaging with normal direction of blood flow towards the liver. Other: No perihepatic ascites. IMPRESSION: Normal gallbladder and liver. Electronically Signed   By: Prudencio Pair M.D.   On: 11/03/2018 21:10    Procedures Procedures (including critical care time)  Medications Ordered in ED Medications  sodium chloride 0.9 % bolus 1,000 mL (0 mLs Intravenous Stopped 11/03/18 1701)  ondansetron (ZOFRAN) injection 4 mg (4 mg Intravenous Given 11/03/18 1544)  morphine 4 MG/ML injection 4 mg (4 mg Intravenous Given 11/03/18 1544)  alum & mag hydroxide-simeth (MAALOX/MYLANTA) 200-200-20 MG/5ML suspension 30 mL (30 mLs Oral Given 11/03/18 1541)    And  lidocaine (XYLOCAINE) 2 % viscous mouth solution 15 mL (15 mLs Oral Given 11/03/18 1541)  iohexol (OMNIPAQUE) 300 MG/ML solution 100 mL (100 mLs Intravenous Contrast Given 11/03/18 1724)  morphine 4 MG/ML injection 4 mg (4 mg Intravenous Given 11/03/18 1747)  ondansetron (ZOFRAN) injection 4 mg (4 mg Intravenous Given 11/03/18 1954)  HYDROmorphone (DILAUDID) injection 1 mg (1 mg Intravenous Given 11/03/18 1958)  HYDROcodone-acetaminophen (NORCO/VICODIN) 5-325 MG per tablet 1 tablet (1 tablet Oral Given 11/03/18 2220)  cephALEXin (KEFLEX) capsule 500 mg (500 mg Oral Given 11/03/18 2220)     Initial Impression / Assessment and Plan / ED Course  I have reviewed the triage vital signs and the nursing notes.  Pertinent labs & imaging results that were available during my care of the patient were reviewed by me and considered in my medical decision making (see  chart for details).        63 y.o. F has been history of Parkinson's who presents for evaluation of upper abdominal pain.  Initially she reports pain began about 2 AM.  She describes it as sharp pain in the upper abdomen and  then started developing some pain in her bilateral flanks.  So she was some nausea but no vomiting.  Also reports decreased appetite.  No fevers, chest pain, difficulty breathing.  She went to urgent care and was prompted to go to the emergency department for further evaluation.  She does note that at urgent care, they evaluated her urine which did show hematuria.  She has not noted any hematuria at home.  On initial ED arrival, she is afebrile.  Vital signs are stable.  She appears uncomfortable but no signs of acute distress.  Tenderness palpation noted to upper abdomen, particularly in the epigastric and right upper quadrant.  She also has some right-sided CVA tenderness.  Consider infectious process versus hepatobiliary etiology versus GU etiology versus GERD versus PUD.  Doubt diverticulitis/appendicitis given history/physical exam.  Additionally, history/physical exam not concerning for ACS etiology, aortic dissection.  Plan for labs, urine.  Will give fluids and pain mucus and reassess.  Urine shows moderate hemoglobin.  No leukocytes, nitrites.  Few bacteria noted.  CMP shows sodium of 133, glucose of 139.  Creatinine is 0.38.  BUN normal.  CBC without any significant leukocytosis.  Hemoglobin stable at 11.9.  Reevaluation.  Patient is still with pain.  She had minor improvement after initial analgesics but now has returned.  Will give additional analgesics.  Given continued pain, will plan for imaging for evaluation of any intra-abdominal abnormality.  CT on pelvis shows subtle hypodensities of the renal parenchyma bilaterally but particular in the right upper kidney which could be concerning for pyelonephritis.  No evidence of kidney stone, hydronephrosis.  Mild gallbladder wall thickening/pericholecystic fluid.  No gallstones noted.  Small volume ascites noted in the lower pelvis.  Discussed results with patient.  Patient does report improvement in pain though still feels tenderness to palpation  on exam.  Vitals are stable.  Given that she is tender in right upper quadrant with findings on CT scan, will proceed with ultrasound.  Discussed with Dr. Alvino Chapel who apparently evaluated patient.  Agrees with plan.  Ultrasound shows normal gallbladder and liver.  No evidence of acute cholecystitis.  Reevaluation.  Patient is sitting up in bed and appears much more comfortable.  Vital signs are stable.  She does report improvement in her pain.  Repeat abdominal exam does show improvement in tenderness.  I discussed results with patient as well as treatment options here in ED.  I discussed with patient that if she is continuing to still have significant pain and does not feel comfortable, we can attempt for admission for pain control and may be possible HIDA scan given location of her pain.  Additionally, discussed findings of CT scan with patient.  Her urine does not appear to be infected but she does report to me that she does get frequent UTIs and she has noticed some increased urinary frequency over the last 24 hours.  We additionally also discussed treating for possible pyelonephritis given findings on CT scan as well as CVA tenderness.  Additionally, would send her home with short course of pain medications to help with pain and have her closely monitor symptoms return the emergency department for any worsening or concerning symptoms.  Patient does not want  to be admitted at this time.  She would like to try and go home and monitor symptoms.  We will plan to p.o. challenge.  Patient able tolerate p.o. without any difficulty.  Vitals are stable.  She is sitting up in bed and appears to be feeling much better.  I again discussed treatment options and she is comfortable going home and does not wish to be admitted.  Does have history of allergies to Augmentin.  I reviewed her records and shows that she has taken Keflex before no difficulty.  We will plan to give short course of pain medications, Keflex and  Zofran for nausea.  Urine was sent for culture.  At this time, do not suspect cardiac etiology of the source the patient's symptoms.  She does not have any chest pain, difficulty breathing.  Her abdominal pain is reproducible on exam and is mostly focused on the right upper quadrant. Discussed patient with Dr. Alvino Chapel who agrees with plan. Patient had ample opportunity for questions and discussion. All patient's questions were answered with full understanding. Strict return precautions discussed. Patient expresses understanding and agreement to plan.   Portions of this note were generated with [employer]. Dictation errors may occur despite best attempts at proofreading.   Final Clinical Impressions(s) / ED Diagnoses   Final diagnoses:  Abdominal pain    ED Discharge Orders         Ordered    cephALEXin (KEFLEX) 500 MG capsule  4 times daily,   Status:  Discontinued     11/03/18 2216    HYDROcodone-acetaminophen (NORCO/VICODIN) 5-325 MG tablet  Every 6 hours PRN     11/03/18 2216    ondansetron (ZOFRAN ODT) 4 MG disintegrating tablet  Every 8 hours PRN     11/03/18 2216    cephALEXin (KEFLEX) 500 MG capsule  4 times daily     11/03/18 2217           Desma Mcgregor 11/03/18 2246    Davonna Belling, MD 11/03/18 [phone]

## 2018-11-03 NOTE — ED Notes (Signed)
ED Provider at bedside. 

## 2018-11-03 NOTE — ED Notes (Signed)
Patient transported to CT 

## 2018-11-05 LAB — URINE CULTURE
Culture: NO GROWTH
Special Requests: NORMAL

## 2018-11-10 ENCOUNTER — Ambulatory Visit: Payer: 59 | Admitting: Neurology

## 2018-11-15 ENCOUNTER — Other Ambulatory Visit: Payer: Self-pay

## 2018-11-15 ENCOUNTER — Encounter: Payer: Self-pay | Admitting: Neurology

## 2018-11-15 ENCOUNTER — Ambulatory Visit: Payer: 59 | Admitting: Neurology

## 2018-11-15 VITALS — BP 95/67 | HR 75 | Temp 97.3°F | Ht 70.0 in | Wt 120.0 lb

## 2018-11-15 DIAGNOSIS — G20 Parkinson's disease: Secondary | ICD-10-CM

## 2018-11-15 MED ORDER — GABAPENTIN 300 MG PO CAPS: 300.0000 mg | ORAL_CAPSULE | Freq: Three times a day (TID) | ORAL | 11 refills | Status: DC | PRN

## 2018-11-15 MED ORDER — MIRTAZAPINE 7.5 MG PO TABS: 7.5000 mg | ORAL_TABLET | Freq: Every day | ORAL | 6 refills | Status: DC

## 2018-11-15 MED ORDER — ROTIGOTINE 4 MG/24HR TD PT24: 1.0000 | MEDICATED_PATCH | Freq: Every day | TRANSDERMAL | 6 refills | Status: DC

## 2018-11-15 NOTE — Patient Instructions (Signed)
Start Gabapentin up to 3x a day. Watch for sedation. Decrease ambien at night. Also mirtazapine, but don;t stat at the same time discussed Increase neupro patch  Mirtazapine tablets What is this medicine? MIRTAZAPINE (mir TAZ a peen) is used to treat depression. This medicine may be used for other purposes; ask your health care provider or pharmacist if you have questions. COMMON BRAND NAME(S): Remeron What should I tell my health care provider before I take this medicine? They need to know if you have any of these conditions:  bipolar disorder  glaucoma  kidney disease  liver disease  suicidal thoughts  an unusual or allergic reaction to mirtazapine, other medicines, foods, dyes, or preservatives  pregnant or trying to get pregnant  breast-feeding How should I use this medicine? Take this medicine by mouth with a glass of water. Follow the directions on the prescription label. Take your medicine at regular intervals. Do not take your medicine more often than directed. Do not stop taking this medicine suddenly except upon the advice of your doctor. Stopping this medicine too quickly may cause serious side effects or your condition may worsen. A special MedGuide will be given to you by the pharmacist with each prescription and refill. Be sure to read this information carefully each time. Talk to your pediatrician regarding the use of this medicine in children. Special care may be needed. Overdosage: If you think you have taken too much of this medicine contact a poison control center or emergency room at once. NOTE: This medicine is only for you. Do not share this medicine with others. What if I miss a dose? If you miss a dose, take it as soon as you can. If it is almost time for your next dose, take only that dose. Do not take double or extra doses. What may interact with this medicine? Do not take this medicine with any of the following medications:  linezolid  MAOIs like  Carbex, Eldepryl, Marplan, Nardil, and Parnate  methylene blue (injected into a vein) This medicine may also interact with the following medications:  alcohol  antiviral medicines for HIV or AIDS  certain medicines that treat or prevent blood clots like warfarin  certain medicines for depression, anxiety, or psychotic disturbances  certain medicines for fungal infections like ketoconazole and itraconazole  certain medicines for migraine headache like almotriptan, eletriptan, frovatriptan, naratriptan, rizatriptan, sumatriptan, zolmitriptan  certain medicines for seizures like carbamazepine or phenytoin  certain medicines for sleep  cimetidine  erythromycin  fentanyl  lithium  medicines for blood pressure  nefazodone  rasagiline  rifampin  supplements like St. John's wort, kava kava, valerian  tramadol  tryptophan This list may not describe all possible interactions. Give your health care provider a list of all the medicines, herbs, non-prescription drugs, or dietary supplements you use. Also tell them if you smoke, drink alcohol, or use illegal drugs. Some items may interact with your medicine. What should I watch for while using this medicine? Tell your doctor if your symptoms do not get better or if they get worse. Visit your doctor or health care professional for regular checks on your progress. Because it may take several weeks to see the full effects of this medicine, it is important to continue your treatment as prescribed by your doctor. Patients and their families should watch out for new or worsening thoughts of suicide or depression. Also watch out for sudden changes in feelings such as feeling anxious, agitated, panicky, irritable, hostile, aggressive, impulsive, severely restless,  overly excited and hyperactive, or not being able to sleep. If this happens, especially at the beginning of treatment or after a change in dose, call your health care  professional. Dennis Bast may get drowsy or dizzy. Do not drive, use machinery, or do anything that needs mental alertness until you know how this medicine affects you. Do not stand or sit up quickly, especially if you are an older patient. This reduces the risk of dizzy or fainting spells. Alcohol may interfere with the effect of this medicine. Avoid alcoholic drinks. This medicine may cause dry eyes and blurred vision. If you wear contact lenses you may feel some discomfort. Lubricating drops may help. See your eye doctor if the problem does not go away or is severe. Your mouth may get dry. Chewing sugarless gum or sucking hard candy, and drinking plenty of water may help. Contact your doctor if the problem does not go away or is severe. What side effects may I notice from receiving this medicine? Side effects that you should report to your doctor or health care professional as soon as possible:  allergic reactions like skin rash, itching or hives, swelling of the face, lips, or tongue  anxious  changes in vision  chest pain  confusion  elevated mood, decreased need for sleep, racing thoughts, impulsive behavior  eye pain  fast, irregular heartbeat  feeling faint or lightheaded, falls  feeling agitated, angry, or irritable  fever or chills, sore throat  hallucination, loss of contact with reality  loss of balance or coordination  mouth sores  redness, blistering, peeling or loosening of the skin, including inside the mouth  restlessness, pacing, inability to keep still  seizures  stiff muscles  suicidal thoughts or other mood changes  trouble passing urine or change in the amount of urine  trouble sleeping  unusual bleeding or bruising  unusually weak or tired  vomiting Side effects that usually do not require medical attention (report to your doctor or health care professional if they continue or are bothersome):  change in  appetite  constipation  dizziness  dry mouth  muscle aches or pains  nausea  tired  weight gain This list may not describe all possible side effects. Call your doctor for medical advice about side effects. You may report side effects to FDA at 1-800-FDA-1088. Where should I keep my medicine? Keep out of the reach of children. Store at room temperature between 15 and 30 degrees C (59 and 86 degrees F) Protect from light and moisture. Throw away any unused medicine after the expiration date. NOTE: This sheet is a summary. It may not cover all possible information. If you have questions about this medicine, talk to your doctor, pharmacist, or health care provider.  2020 [employer]/Gold Standard (2015-08-23 17:30:45)   Gabapentin capsules or tablets What is this medicine? GABAPENTIN (GA ba pen tin) is used to control seizures in certain types of epilepsy. It is also used to treat certain types of nerve pain. This medicine may be used for other purposes; ask your health care provider or pharmacist if you have questions. COMMON BRAND NAME(S): Active-PAC with Gabapentin, Gabarone, Neurontin What should I tell my health care provider before I take this medicine? They need to know if you have any of these conditions:  history of drug abuse or alcohol abuse problem  kidney disease  lung or breathing disease  suicidal thoughts, plans, or attempt; a previous suicide attempt by you or a family member  an unusual or  allergic reaction to gabapentin, other medicines, foods, dyes, or preservatives  pregnant or trying to get pregnant  breast-feeding How should I use this medicine? Take this medicine by mouth with a glass of water. Follow the directions on the prescription label. You can take it with or without food. If it upsets your stomach, take it with food. Take your medicine at regular intervals. Do not take it more often than directed. Do not stop taking except on your doctor's  advice. If you are directed to break the 600 or 800 mg tablets in half as part of your dose, the extra half tablet should be used for the next dose. If you have not used the extra half tablet within 28 days, it should be thrown away. A special MedGuide will be given to you by the pharmacist with each prescription and refill. Be sure to read this information carefully each time. Talk to your pediatrician regarding the use of this medicine in children. While this drug may be prescribed for children as young as 3 years for selected conditions, precautions do apply. Overdosage: If you think you have taken too much of this medicine contact a poison control center or emergency room at once. NOTE: This medicine is only for you. Do not share this medicine with others. What if I miss a dose? If you miss a dose, take it as soon as you can. If it is almost time for your next dose, take only that dose. Do not take double or extra doses. What may interact with this medicine? This medicine may interact with the following medications:  alcohol  antihistamines for allergy, cough, and cold  certain medicines for anxiety or sleep  certain medicines for depression like amitriptyline, fluoxetine, sertraline  certain medicines for seizures like phenobarbital, primidone  certain medicines for stomach problems  general anesthetics like halothane, isoflurane, methoxyflurane, propofol  local anesthetics like lidocaine, pramoxine, tetracaine  medicines that relax muscles for surgery  narcotic medicines for pain  phenothiazines like chlorpromazine, mesoridazine, prochlorperazine, thioridazine This list may not describe all possible interactions. Give your health care provider a list of all the medicines, herbs, non-prescription drugs, or dietary supplements you use. Also tell them if you smoke, drink alcohol, or use illegal drugs. Some items may interact with your medicine. What should I watch for while using  this medicine? Visit your doctor or health care provider for regular checks on your progress. You may want to keep a record at home of how you feel your condition is responding to treatment. You may want to share this information with your doctor or health care provider at each visit. You should contact your doctor or health care provider if your seizures get worse or if you have any new types of seizures. Do not stop taking this medicine or any of your seizure medicines unless instructed by your doctor or health care provider. Stopping your medicine suddenly can increase your seizures or their severity. This medicine may cause serious skin reactions. They can happen weeks to months after starting the medicine. Contact your health care provider right away if you notice fevers or flu-like symptoms with a rash. The rash may be red or purple and then turn into blisters or peeling of the skin. Or, you might notice a red rash with swelling of the face, lips or lymph nodes in your neck or under your arms. Wear a medical identification bracelet or chain if you are taking this medicine for seizures, and carry a card  that lists all your medications. You may get drowsy, dizzy, or have blurred vision. Do not drive, use machinery, or do anything that needs mental alertness until you know how this medicine affects you. To reduce dizzy or fainting spells, do not sit or stand up quickly, especially if you are an older patient. Alcohol can increase drowsiness and dizziness. Avoid alcoholic drinks. Your mouth may get dry. Chewing sugarless gum or sucking hard candy, and drinking plenty of water will help. The use of this medicine may increase the chance of suicidal thoughts or actions. Pay special attention to how you are responding while on this medicine. Any worsening of mood, or thoughts of suicide or dying should be reported to your health care provider right away. Women who become pregnant while using this medicine may  enroll in the [place] Pregnancy Registry by calling [phone]. This registry collects information about the safety of antiepileptic drug use during pregnancy. What side effects may I notice from receiving this medicine? Side effects that you should report to your doctor or health care professional as soon as possible:  allergic reactions like skin rash, itching or hives, swelling of the face, lips, or tongue  breathing problems  rash, fever, and swollen lymph nodes  redness, blistering, peeling or loosening of the skin, including inside the mouth  suicidal thoughts, mood changes Side effects that usually do not require medical attention (report to your doctor or health care professional if they continue or are bothersome):  dizziness  drowsiness  headache  nausea, vomiting  swelling of ankles, feet, hands  tiredness This list may not describe all possible side effects. Call your doctor for medical advice about side effects. You may report side effects to FDA at 1-800-FDA-1088. Where should I keep my medicine? Keep out of reach of children. This medicine may cause accidental overdose and death if it taken by other adults, children, or pets. Mix any unused medicine with a substance like cat litter or coffee grounds. Then throw the medicine away in a sealed container like a sealed bag or a coffee can with a lid. Do not use the medicine after the expiration date. Store at room temperature between 15 and 30 degrees C (59 and 86 degrees F). NOTE: This sheet is a summary. It may not cover all possible information. If you have questions about this medicine, talk to your doctor, pharmacist, or health care provider.  2020 [employer]/Gold Standard (2018-06-25 14:16:43)

## 2018-11-15 NOTE — Progress Notes (Signed)
[facility]    Provider:  Dr Jaynee Eagles  CC:  Parkinson's disease:  Interval history 11/15/2018: She has tingling n the back area, scratching helps it. Since she saw me [name], who is a movement disorder specialist. The specialist recommend mirtazapine and Gabapentin. Sleeping is not better. She is on 3m Neuro patch and doing well. Start with the Gabapentin, decrease ambien at night. Also in a few weeks depending on how doing may switch mirtazapine or add it to the regimen but beware of sedation, discussed at length but decrease ambien and woul dlike to get her off of the [place] with studies associating   Interval history Aug 25, 2018: I connected with this lovely patient and her husband today to follow-up on her Parkinson's disease.  She was started on oral dopamine agonists and had a sleep attack she was switched to the Neupro patch which is working quite well for her.  She feels as though she could increase it.  On the 2 mg, felt better. She still has insomnia but that is not new. She feels pressure on her bladder at night, she took peridium and it helped. I recommended she have her urine tested, she went to her pcp and he tested her urine, she had these symptoms prior to starting the medication and has been working with her pcp. She bought a bike for exercise. Sheis exercising daily for 30 minutes. She is also going to start boxing. She feels she needs an increase to her neupro, will increase to 352m She has tingling in the leg with radicular appointments. Asked her to fu with stern, I don;t have access to the mr lumbar spine. Will repeat CT of the head to look at the focus in the brain in 6 months. Discussed PFO, follow with primary care.  Discussed her use of Ambien, to reports have shown association with Parkinson's disease, at this point will change to clonazepam which is also used for REM sleep disorder.  Addendum 06/03/2018: DAT Scan: IMPRESSION:Loss of dopamine  transport activity in the bilateral striata is a pattern typical of Parkinson's syndrome pathology  Addendum 05/20/2018: Patient with reported low back pain with radiculopathy of right leg, right arm and leg weakness and sensory changes, diffuse pain. Reviewed workup with patient today. EMG/NCS within normal limits. She still has episodes of leg tremoring but she is feeling better and if she tells herself to stop she can stop it. She also still feels symptomatic in her right arm and right leg. But symptoms happen separately so may not be related. She went to physical therapy and she loves it. She is going to start Pilates. Reviewed MRI of the brain and thoracic imaging and labwork to date with patient which is extensive (see below). [facility]. Her worst symptoms are the tingling and numbness in the prox arm and legs and the tremor. She asks me for a refill of Ambien, I will give her one refill of ambien but I do not agree with long-term use so she will have to see her primary care to discuss. Needs a referral to a sleep counselor.  Patient needs cognitive behavioral therapy for insomnia. Please refer to [facility]  MRI brain: This MRI of the brain with and without contrast shows the following:  1.   Brain parenchyma appears normal before and after contrast. 2.   7 to 8 mm focus in the right skull.  Surrounding  skull appears normal.   This appears to be benign. She should repeat CT head in 6 months to a year to follow right skull focus, explained she should review with pcp and ask her to order as she may not need follow up in neurology. At this time she is looking for a new pcp and understands it is her responsibility to follow up on this. MRI Thoracic Spine: This MRI of the thoracic spine with and without contrast shows the following: 1.   The spinal cord appears normal. 2.   Small disc protrusions at T7-T8, T8-T9 and T10-T11  that do not cause nerve root compression or spinal stenosis. 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 Showed a small pfo. Emailed Dr. Rayann Heman, no intervention needed.  Parkinsonism?: Pending DAT scan  Normal labs: Vit D, MMA, Lyme, tsh, HIV, esr, sjogrens, pan-anca, b12, folate, rpr, help c, paraneoplatic abs, celiac antibodies, heavy metals, b6, IFE/SPEP, copper, b1, cmo, cbc, ana   HPI:  Caitlin Braun is a 63 y.o. female here as requested by Dr. Erline Levine for low back pain.  She has been seen by [place] [facility] and EMG nerve conduction study of the right upper extremity did not show any etiology for her symptoms. She has been extensively evaluated at [place] with MRI c-spine and MRI L-spine and emg/ncs of the right arm. She has seen rheumatology. Here with her husband who provides much information. About 2 years she started feeling tired and lethargic, she was feeling joint pain diffuse. Then she started having right thumb pain and saw rheumatologist who allayed her fears about rheumatologic disease, diagnosed with hyper joints. She has had neck issues under her should blade on her right side and tingles and radiates down the right arm to the thumb. Massage and heat helps. She has some tremor in the right arm. Also when she sits on the toilet her right leg tremors. Also her handwriting is affected. She has weakness in the right hand, her hand goes numb, she can hardly write. She has weakness in the right hand. No problems with smell or taste, but he appetite has decreased and taste has changed per husband. No drooling or wet pillows at night. She has imbalance. No REM sleep disorder. No falls. She denies incontinence, She mumbles a lot this is not new but husband has hearing difficulties, no hypophonia. Husband has noticed some shuffling. No changes in facial expressions. She has back pain and radiation into the right leg. Numbness in the right leg. No other focal neurologic deficits,  associated symptoms, inciting events or modifiable factors.  Reviewed notes, labs and imaging from outside physicians, which showed:  Reviewed notes from Dr. Vertell Limber in his office neurosurgery and spine.:  Patient has been experiencing right upper extremity neck pain and also numbness and tingling in digits 1 and 2.  Diminished dexterity in the right hand.  Past medical history is otherwise noncontributory.  She has right shoulder right arm and right some numbness and tingling.  Numbness is exacerbated by use.  She denies any neck pain.  She also notes some right leg numbness and tingling that begins in her right head and extends into her right outer toes.  She is noticed balance issues and handwriting issues over the last several months.  She also reports tremors in her right hand.  Her rheumatologist diagnosed hyperflexible right thumb and elevate her concern for autoimmune process.  On exam her neurologic exam is significant for decreased sensory in the  right C6 distribution, Spurling's positive on the right, Tinel's positive on the right, Phalen's positive on the right.  She does have 4 out of 5 right hand intrinsics weakness and decreased pin sensitivity in the first and second digits on the right otherwise negative straight leg raise negative right sciatic notch discomfort.  Reviewed MRI of the cervical spine report I do not have imaging.  Impression is L4-L5 bilateral facet Orth osteoarthritis allowing 2 mm of anterolisthesis.  Mild bulging of the disc.  No compressive stenosis, the back arthritis would be a cause of back pain or referred pain.  Chronic appearing degenerative changes of the discs at L3-L4 and L5-S1 but without herniation.  No stenosis or neural compression.  Exam date January 27, 2018.  MRI of the cervical spine reviewed report I do not have the images available: No significant degenerative changes, no stenosis or neural compression, no abnormality seen to explain neck pain and right  arm symptoms.  T2 bright focus within the anterior right T1 vertebral body which may be a benign cyst would recommend, suggest a contrast-enhanced examination to rule out enhancing marrow space lesion.  Labs include normal thyroid panel, BUN 16 and creatinine 0.58 collected 03/25/2017, ANA negative, rheumatoid arthritis negative, sed rate negative, CEA normal, CA normal CBC was unremarkable April 11, 2017 except for mildly low platelets at 136.  Reviewed data and results of electrodiagnostic testing summary includes right upper extremity was within normal limits with no evidence for cervical radiculopathy, brachial plexopathy, peripheral median or ulnar neuropathy as well as polyneuropathy.  Per the note she could be experiencing some cervical radiculitis is not manifesting on electrodiagnostic testing, recommended MRI cervical spine.  Also recommended possibly repeating in the future.  Due to right arm numbness right arm weakness and right arm pain.     Review of Systems: Patient complains of symptoms per HPI as well as the following symptoms: numbness, weakness, insomnia, leg pain, tremor, aching muscles . Pertinent negatives and positives per HPI. All others negative.   Social History   Socioeconomic History   Marital status: Married    Spouse name: Not on file   Number of children: 3   Years of education: Not on file   Highest education level: Bachelor's degree (e.g., BA, AB, BS)  Occupational History   Not on file  Social Needs   Financial resource strain: Not on file   Food insecurity    Worry: Not on file    Inability: Not on file   Transportation needs    Medical: Not on file    Non-medical: Not on file  Tobacco Use   Smoking status: Never Smoker   Smokeless tobacco: Never Used  Substance and Sexual Activity   Alcohol use: Yes    Alcohol/week: 3.0 standard drinks    Types: 3 Standard drinks or equivalent per week   Drug use: No   Sexual activity: Not on  file  Lifestyle   Physical activity    Days per week: Not on file    Minutes per session: Not on file   Stress: Not on file  Relationships   Social connections    Talks on phone: Not on file    Gets together: Not on file    Attends religious service: Not on file    Active member of club or organization: Not on file    Attends meetings of clubs or organizations: Not on file    Relationship status: Not on file   Intimate  partner violence    Fear of current or ex partner: Not on file    Emotionally abused: Not on file    Physically abused: Not on file    Forced sexual activity: Not on file  Other Topics Concern   Not on file  Social History Narrative   Lives at home with husband   Right handed   Caffeine: 1/2 cup "maybe" daily    Family History  Problem Relation Age of Onset   Cancer Mother    Cancer Father    Cancer Brother    Breast cancer Neg Hx    Parkinson's disease Neg Hx     Past Medical History:  Diagnosis Date   GAD (generalized anxiety disorder)    Osteoporosis    Parkinson disease ([place])     Past Surgical History:  Procedure Laterality Date   ABDOMINAL HYSTERECTOMY     AUGMENTATION MAMMAPLASTY Bilateral 2016, 1998   Patient had them redone in 2016   COSMETIC SURGERY     face lift     NOSE SURGERY     cosmetic    Current Outpatient Medications  Medication Sig Dispense Refill   ALPRAZolam (XANAX) 0.25 MG tablet Take 0.25 mg by mouth as needed.      Biotin w/ Vitamins C & E (HAIR/SKIN/NAILS PO) Take by mouth.     Calcium Carb-Cholecalciferol (CALCIUM + D3 PO) Take 500 mg by mouth daily.     Cholecalciferol (VITAMIN D3 PO) Take 2,000 Int'l Units by mouth daily.     Fexofenadine-Pseudoephedrine (ALLEGRA-D 12 HOUR PO) Take 1 tablet by mouth daily.      FLUTICASONE PROPIONATE NA Place 1 spray into both nostrils at bedtime.     ketotifen (ZADITOR) 0.025 % ophthalmic solution Place 1 drop into both eyes as needed.     KRILL OIL PO  Take 500 mg by mouth daily.     MAGNESIUM PO Take by mouth daily.      Minoxidil (ROGAINE EX) Apply 2 drops topically daily.     montelukast (SINGULAIR) 10 MG tablet Take 10 mg by mouth at bedtime.     Multiple Vitamin (MULTIVITAMIN) tablet Take 1 tablet by mouth daily.     Potassium Gluconate 550 MG TABS Take 1 tablet by mouth daily.     Probiotic Product (PROBIOTIC PO) Take by mouth.     Wheat Dextrin (BENEFIBER PO) Take by mouth.     Zolpidem Tartrate (AMBIEN PO) Take by mouth. Takes either 10 mg or 12.mg CR daily at bedtime     gabapentin (NEURONTIN) 300 MG capsule Take 1 capsule (300 mg total) by mouth 3 (three) times daily as needed. 90 capsule 11   mirtazapine (REMERON) 7.5 MG tablet Take 1 tablet (7.5 mg total) by mouth at bedtime. 90 tablet 6   ondansetron (ZOFRAN ODT) 4 MG disintegrating tablet Take 1 tablet (4 mg total) by mouth every 8 (eight) hours as needed for nausea or vomiting. (Patient not taking: Reported on 11/15/2018) 6 tablet 0   rotigotine (NEUPRO) 4 MG/24HR Place 1 patch onto the skin daily. Use one patch daily as instructed 90 patch 6   No current facility-administered medications for this visit.     Allergies as of 11/15/2018 - Review Complete 11/15/2018  Allergen Reaction Noted   Augmentin [amoxicillin-pot clavulanate] Hives 03/02/2018   Perflutren lipid microsphere Other (See Comments) 03/24/2018    Vitals: BP 95/67 (BP Location: Right Arm, Patient Position: Sitting)    Pulse 75  Temp (!) 97.3 F (36.3 C) Comment: husband 96.4 temp; both taken by check-in staff   Ht '5\' 10"'  (1.778 m)    Wt 120 lb (54.4 kg)    BMI 17.22 kg/m  Last Weight:  Wt Readings from Last 1 Encounters:  11/15/18 120 lb (54.4 kg)   Last Height:   Ht Readings from Last 1 Encounters:  11/15/18 '5\' 10"'  (1.778 m)   Physical exam: Exam: Gen: NAD, conversant, well nourised, thin,, well groomed                     CV: RRR, +SEM. No Carotid Bruits. No peripheral edema, warm,  nontender Eyes: Conjunctivae clear without exudates or hemorrhage MSK: tall, thin, thin and long fingers, joint laxity  Neuro: Detailed Neurologic Exam  Speech:    Speech is normal; fluent and spontaneous with normal comprehension.  Cognition:    The patient is oriented to person, place, and time;     recent and remote memory intact;     language fluent;     normal attention, concentration,     fund of knowledge Cranial Nerves: Hypomimia    The pupils are equal, round, and reactive to light. The fundi are normal and spontaneous venous pulsations are present. Visual fields are full to finger confrontation. Extraocular movements are intact. Trigeminal sensation is intact and the muscles of mastication are normal. Bilateral ptosis. The palate elevates in the midline. Hearing intact. Voice is normal. Shoulder shrug is normal. The tongue has normal motion without fasciculations.   Coordination:    Normal finger to nose and heel to shin. Normal rapid alternating movements.   Gait:    Heel-toe and tandem gait are normal.  Decreased right arm swing.   Motor Observation:    Postural tremor Tone:    Normal muscle tone.   Posture:    Posture is normal. normal erect    Strength: Mild generalized weakness more proximaly 4+/5 in the deltoids and hip flexors    Strength is V/V in the upper and lower limbs.      Sensation: intact to LT     Reflex Exam:  DTR's: right biceps slightly increased as compared to the left.  Brisk lowers for age.     Toes:    The toes are downgoing bilaterally.   Clonus:    2 beats clonus at AJs        Assessment/Plan:  63 year old female with multiple symptoms including chronic neck and back pain, radicular symptoms, fatigue, lethargy, diffuse pain, hyer joints, neck pain muscular, tremors, worsening weakness in right arm and hand and numbness, imbalance, hypomimia, decreased arm swing, numbnes sin right leg. She has already been extensively evaluated  without causes found.  Extensive evaluation including MRI of the brain, MRI of the cervical spine, DaTscan revealed Parkinson's disease confirmed by Dr. Wells Guiles Tat who is an expert in this field and Dr. [name] (will request notes) who is a movement disorder expert at [facility] and recommended gabapentin and mirtazapine which I agree with and prescribed.   - moe affected on the right side, decreased arm swing and cogwheeling, feels weak but I feel this is her parkinson's diseas she has had extensive MRIs c-spine and brain may also be arthritic as well.   - Start Gabapentin for paresthesias, strt at night and can increase to up to 3x a day, decrease ambien.  - can also try mirtazapine if gabapentin does not work.  - CT scan  of the head with/w ocntrast to follow up on the skull lesion seen on MRI head. Stable.   - Doing well on dopamine agonist, increase to 89m neupro - 4 months in the office for follow-up, 1 hour - Discussed her use of Ambien, to reports have shown association with Parkinson's disease, at this point will change to gabapentin/mirtazapine may help with anxiety too.  - Lumbar radiculopathy: feels better with exercise, f/u with Dr. HMaryjean Kaif needed, she canceled her last shot bc she was feeling better.  Patient has lumbar radiculopathy, has seen Dr. SVertell Limberin the past for this, requesting epidural steroid injections or evaluation and treatment as clinically warranted with pain procedures.  No orders of the defined types were placed in this encounter.    Addendum 06/03/2018: DAT Scan: IMPRESSION:Loss of dopamine transport activity in the bilateral striata is a pattern typical of Parkinson's syndrome pathology  Addendum 05/20/2018: Patient with reported low back pain with radiculopathy of right leg, right arm and leg weakness and sensory changes, diffuse pain. Reviewed workup with patient today. EMG/NCS within normal limits. She still has episodes of leg tremoring but she is feeling  better and if she tells herself to stop she can stop it. She also still feels symptomatic in her right arm and right leg. But symptoms happen separately so may not be related. She went to physical therapy and she loves it. She is going to start Pilates. Reviewed MRI of the brain and thoracic imaging and labwork to date with patient which is extensive (see below). [facility] Her worst symptoms are the tingling and numbness in the prox arm and legs and the tremor. She asks me for a refill of Ambien, I will give her one refill of ambien but I do not agree with long-term use so she will have to see her primary care to discuss. Needs a referral to a sleep counselor.  Patient needs cognitive behavioral therapy for insomnia. [facility]  MRI brain: This MRI of the brain with and without contrast shows the following:  1.   Brain parenchyma appears normal before and after contrast. 2.   7 to 8 mm focus in the right skull.  Surrounding skull appears normal.   This appears to be benign. She should repeat CT head in 6 months to a year to follow right skull focus, explained she should review with pcp and ask her to order as she may not need follow up in neurology. At this time she is looking for a new pcp and understands it is her responsibility to follow up on this. MRI Thoracic Spine: This MRI of the thoracic spine with and without contrast shows the following: 1.   The spinal cord appears normal. 2.   Small disc protrusions at T7-T8, T8-T9 and T10-T11 that do not cause nerve root compression or spinal stenosis. 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 Showed a small pfo. Emailed Dr. ARayann Heman no intervention needed.  Normal labs: Vit D, MMA, Lyme, tsh, HIV, esr, sjogrens, pan-anca, b12, folate, rpr, help c, paraneoplatic abs, celiac antibodies, heavy metals, b6, IFE/SPEP, copper, b1, cmo, cbc, ana  Initial  Assessment and plan 03/02/2018:  - Need to evaluate for MS given diffuse symptoms. MRI brain and cervical spine w/wo contrast - MRI of the brain to evaluate for MS, stroke, masses or other lesions causing her focal neurologic symptoms. Also MRI cervical and thoracic spine w/wo  contrast to evaluate for  T2 bright focus within the anterior right T1 vertebral body which may be a benign cyst but suggest a contrast-enhanced examination to rule out enhancing marrow space lesion or other lesions. - Right rhomboid and trap tightness, heat and massage feels good. Dry needling for cervical myofascial pain - EMG/NCS of the right arm and right leg - Phenotypically she appears Marfan-like?: echocardiogram and f/u with pcp to discuss referral if clinically warranted and possibly genetic testing - Consider DAT scan, parkinsonism on exam - Tremor: may be essential tremor? MRI brain and cervical spine - Extensive lab testing today - Physical therapy for right-sided and pain and gait abnormality and proximal weakness: Mild generalized weakness more proximaly 4+/5 in the deltoids and hip flexors - A wrist splint to support right wrist and right thumb. - Xray of right hand  No orders of the defined types were placed in this encounter.   Cc: [name], MD, Dr. Erline Levine  A total of 45 minutes was spent face-to-face with this patient. Over half this time was spent on counseling patient on the  1. Parkinson's disease ([place])    diagnosis and different diagnostic and therapeutic options, counseling and coordination of care, risks ans benefits of management, compliance, or risk factor reduction and education.     Sarina Ill, MD  [facility] [address] [place], [place] [address]  Phone [phone] Fax [phone]

## 2018-12-02 ENCOUNTER — Telehealth: Payer: Self-pay | Admitting: *Deleted

## 2018-12-06 ENCOUNTER — Telehealth: Payer: Self-pay | Admitting: *Deleted

## 2018-12-06 NOTE — Telephone Encounter (Addendum)
R/c notes [facility] notes on [employer]. Fax # [phone] # [phone] email [email or web address]

## 2018-12-07 ENCOUNTER — Other Ambulatory Visit: Payer: Self-pay | Admitting: Neurology

## 2019-01-05 ENCOUNTER — Other Ambulatory Visit: Payer: Self-pay

## 2019-01-05 DIAGNOSIS — Z20822 Contact with and (suspected) exposure to covid-19: Secondary | ICD-10-CM

## 2019-01-06 LAB — NOVEL CORONAVIRUS, NAA: SARS-CoV-2, NAA: NOT DETECTED

## 2019-01-10 ENCOUNTER — Ambulatory Visit: Payer: 59 | Admitting: Neurology

## 2019-02-09 ENCOUNTER — Other Ambulatory Visit: Payer: Self-pay | Admitting: Neurology

## 2019-02-09 MED ORDER — ROPINIROLE HCL ER 2 MG PO TB24: 2.0000 mg | ORAL_TABLET | Freq: Every day | ORAL | 3 refills | Status: DC

## 2019-02-12 ENCOUNTER — Other Ambulatory Visit: Payer: Self-pay | Admitting: Neurology

## 2019-02-12 MED ORDER — ROPINIROLE HCL ER 2 MG PO TB24: 2.0000 mg | ORAL_TABLET | Freq: Every day | ORAL | 3 refills | Status: DC

## 2019-03-09 IMAGING — MG DIGITAL DIAGNOSTIC UNILATERAL LEFT MAMMOGRAM WITH IMPLANTS, CAD
6 series · 6 of 14 positions shown · non-contrast
Comparison: Previous exam(s).

CLINICAL DATA: Left breast nipple changes noted by the patient.
Clinically, there was a possible mass at 6 o'clock in the left
breast.

EXAM:
DIGITAL DIAGNOSTIC LEFT MAMMOGRAM WITH IMPLANTS, CAD AND TOMO
ULTRASOUND LEFT BREAST
The patient has retropectoral implants. Standard and implant
displaced views were performed.

[L MLO]
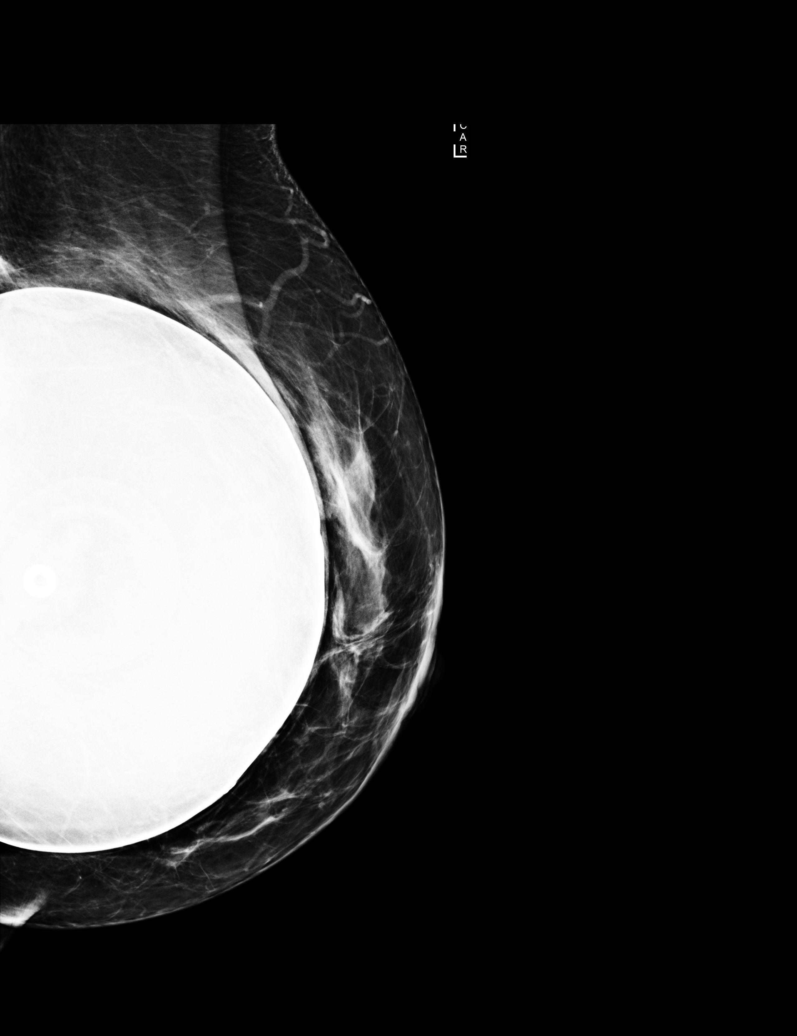

[L CC]
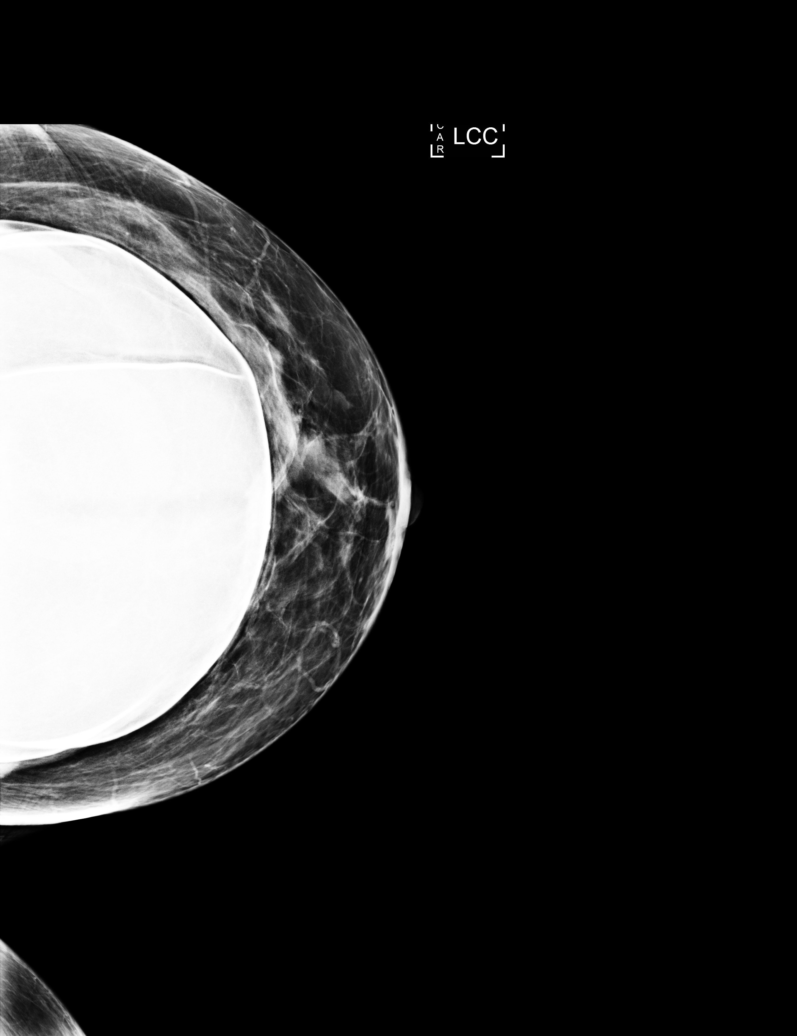

[L CC synth-2D]
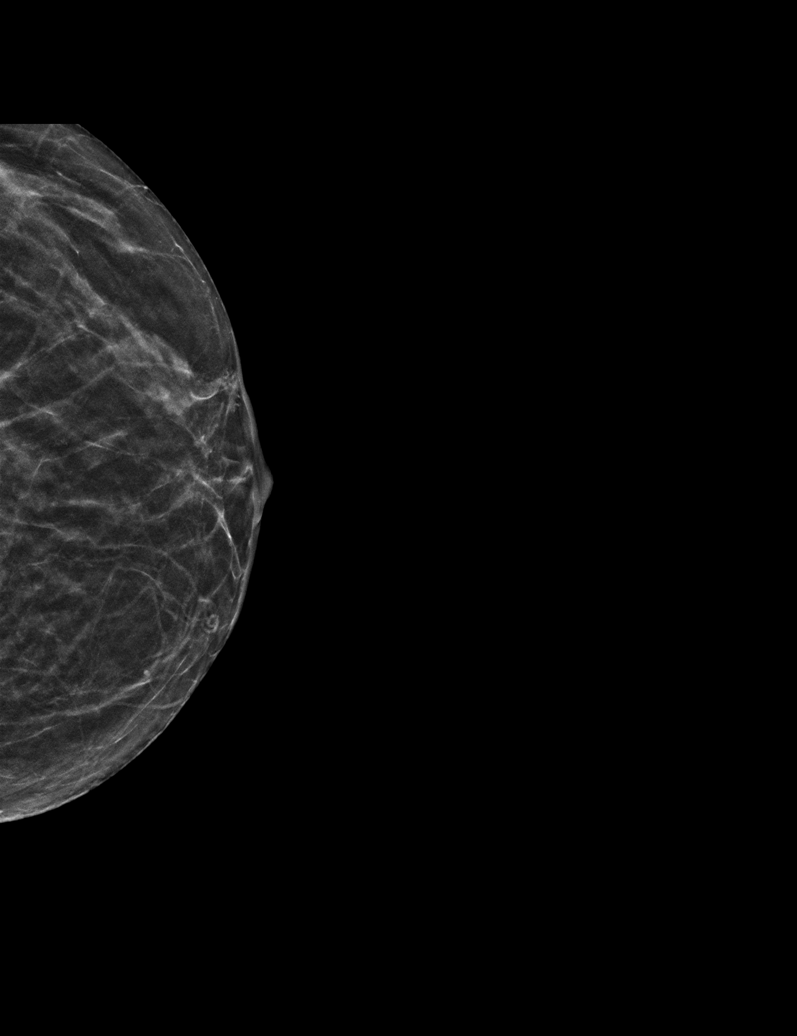

[L MLO synth-2D]
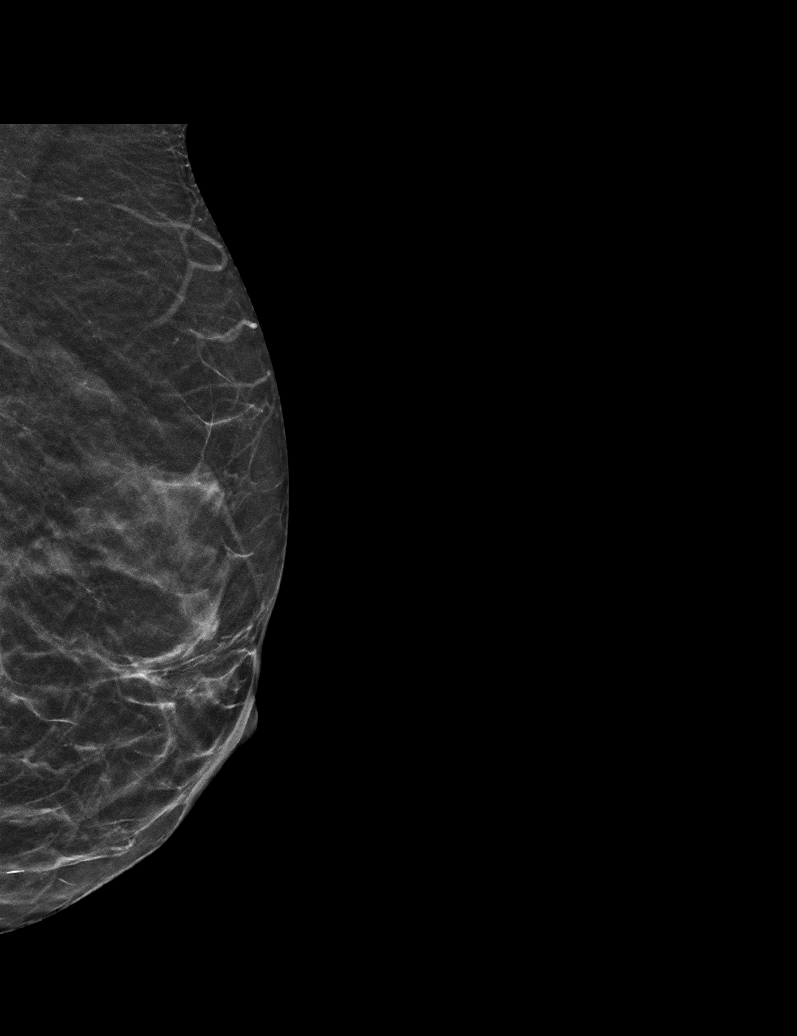

[L MLOID BREAST TOMOSYNTHESIS IMAGE tomo · tomo slice 19/36.0]
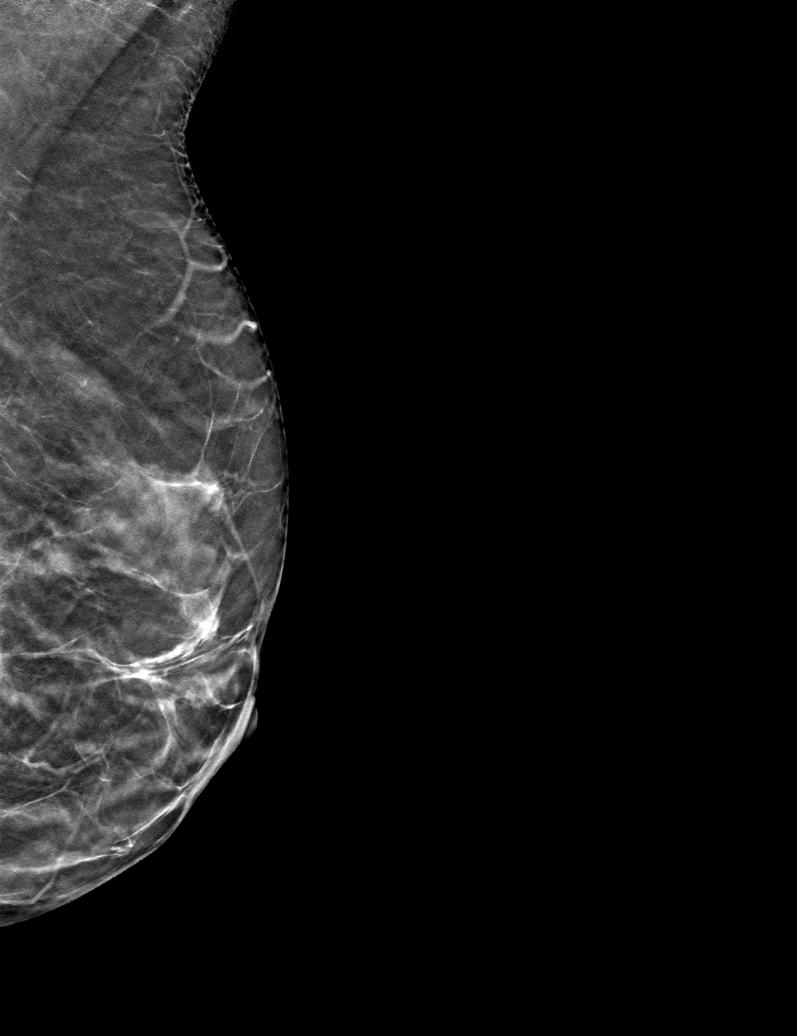

[L CCID BREAST TOMOSYNTHESIS IMAGE tomo · tomo slice 17/33.0]
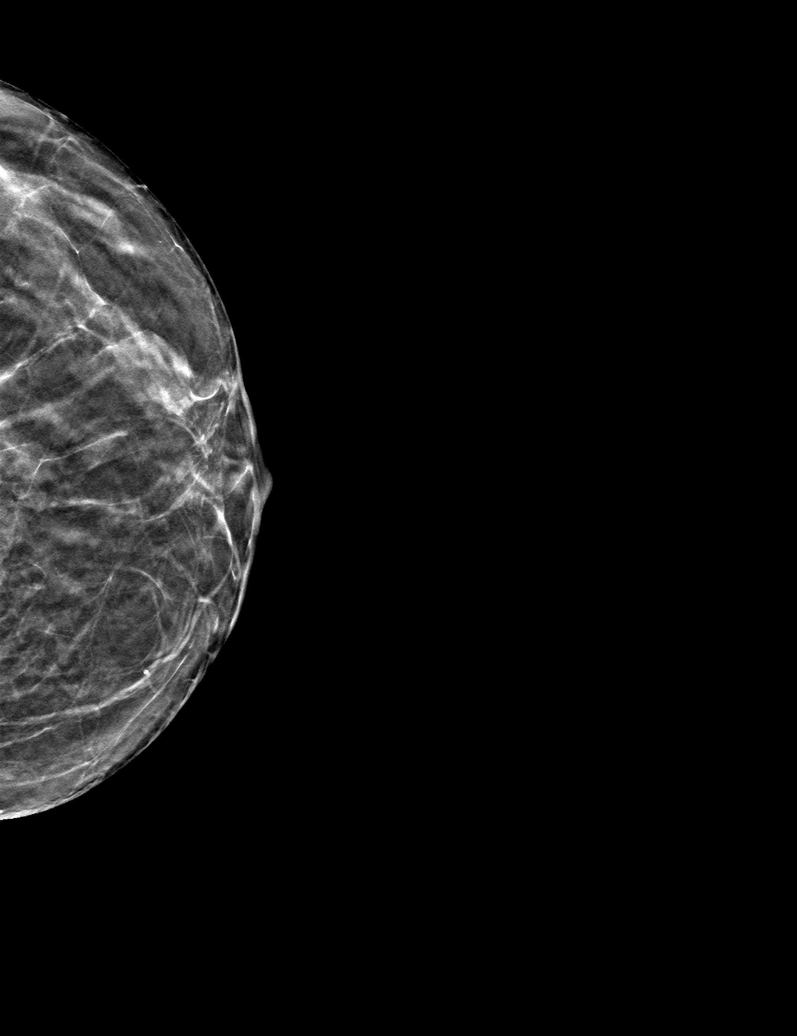

[6 of 14 positions shown; findings below may reference images not displayed]

ACR Breast Density Category c: The breast tissue is heterogeneously
dense, which may obscure small masses.
FINDINGS: Mammographically, there are no suspicious masses, areas of
nonsurgical architectural distortion or microcalcifications in the
left breast. Stable subpectoral saline implant noted.

On physical exam, no suspicious masses are palpated. No skin changes
in the left nipple. Postsurgical scarring from reduction mammoplasty
is noted.

Targeted ultrasound is performed, showing no suspicious masses or
shadowing lesions in the left breast.

Mammographic images were processed with CAD.
IMPRESSION: No mammographic or sonographic evidence of malignancy in the left
breast.

RECOMMENDATION:
Further management of patient's nipple changes and left lateral
breast discomfort should be based on clinical grounds. A
dermatological consultation was suggested to the patient.

Otherwise, the patient is due for a bilateral screening mammogram in
September 2017.

I have discussed the findings and recommendations with the patient.
Results were also provided in writing at the conclusion of the
visit. If applicable, a reminder letter will be sent to the patient
regarding the next appointment.

BI-RADS CATEGORY  2: Benign.

## 2019-04-12 MED ORDER — NEUPRO 4 MG/24HR TD PT24: MEDICATED_PATCH | TRANSDERMAL | 0 refills | Status: DC

## 2019-04-12 NOTE — Telephone Encounter (Signed)
Neupro 4 mg patch sample order placed in chart and samples are ready for patient pickup. I have also included a medication list for the patient with the instructions for the patch.

## 2019-04-20 ENCOUNTER — Other Ambulatory Visit: Payer: Self-pay | Admitting: Neurology

## 2019-04-21 ENCOUNTER — Other Ambulatory Visit: Payer: Self-pay | Admitting: Neurology

## 2019-04-21 MED ORDER — ROPINIROLE HCL ER 6 MG PO TB24: 6.0000 mg | ORAL_TABLET | Freq: Every day | ORAL | 6 refills | Status: DC

## 2019-04-22 ENCOUNTER — Ambulatory Visit: Payer: 59 | Attending: Internal Medicine

## 2019-04-22 DIAGNOSIS — Z20822 Contact with and (suspected) exposure to covid-19: Secondary | ICD-10-CM

## 2019-04-23 LAB — NOVEL CORONAVIRUS, NAA: SARS-CoV-2, NAA: NOT DETECTED

## 2019-05-18 ENCOUNTER — Ambulatory Visit: Payer: 59 | Admitting: Neurology

## 2019-05-18 ENCOUNTER — Encounter: Payer: Self-pay | Admitting: Neurology

## 2019-05-18 ENCOUNTER — Other Ambulatory Visit: Payer: Self-pay

## 2019-05-18 VITALS — BP 101/67 | HR 69 | Temp 96.8°F | Ht 70.5 in | Wt 129.0 lb

## 2019-05-18 DIAGNOSIS — G20 Parkinson's disease: Secondary | ICD-10-CM | POA: Diagnosis not present

## 2019-05-18 MED ORDER — ALPRAZOLAM 0.5 MG PO TABS: 0.5000 mg | ORAL_TABLET | Freq: Three times a day (TID) | ORAL | 2 refills | Status: DC | PRN

## 2019-05-18 MED ORDER — GABAPENTIN 300 MG PO CAPS: 300.0000 mg | ORAL_CAPSULE | Freq: Three times a day (TID) | ORAL | 4 refills | Status: DC | PRN

## 2019-05-18 MED ORDER — ROPINIROLE HCL ER 6 MG PO TB24: 6.0000 mg | ORAL_TABLET | Freq: Every day | ORAL | 6 refills | Status: DC

## 2019-05-18 NOTE — Progress Notes (Signed)
[facility]    Provider:  Dr Jaynee Eagles  CC:  Parkinson's disease:  Interval history 05/18/2019: She moves a little slower. She tremors when she is overtired. We discussed [facility] movement disorders team, we also discussed Education officer, museum at [employer] [facility], they do not feel the social worker will help, Patient would like to be referred to a movement disorder specialist clinic and when appropriate they would like DBS, will refer to [facility] Dr. Donna Christen team. [name] but despite the thngs we tried, we could not resolve her rash on the pastch. We have changed to oral DA and she feels better, she has some swelling and also feels she is a little bloated but tolerating well, she had a colonoscopy and she is fine, discussed association with skin cancers she needs to get a check up regularly. Discussed with husband. She has no REM sleep disorder. She feels stable, sleeping better. Can give her limited xanax.   Interval history 11/15/2018: She has tingling n the back area, scratching helps it. Since she saw me [name], who is a movement disorder specialist. The specialist recommend mirtazapine and Gabapentin. Sleeping is not better. She is on 74m Neuro patch and doing well. Start with the Gabapentin, decrease ambien at night. Also in a few weeks depending on how doing may switch mirtazapine or add it to the regimen but beware of sedation, discussed at length but decrease ambien and woul dlike to get her off of the [place] with studies associating   Interval history Aug 25, 2018: I connected with this lovely patient and her husband today to follow-up on her Parkinson's disease.  She was started on oral dopamine agonists and had a sleep attack she was switched to the Neupro patch which is working quite well for her.  She feels as though she could increase it.  On the 2 mg, felt better. She still has insomnia but that is not new. She feels pressure on her bladder at  night, she took peridium and it helped. I recommended she have her urine tested, she went to her pcp and he tested her urine, she had these symptoms prior to starting the medication and has been working with her pcp. She bought a bike for exercise. Sheis exercising daily for 30 minutes. She is also going to start boxing. She feels she needs an increase to her neupro, will increase to 350m She has tingling in the leg with radicular appointments. Asked her to fu with stern, I don;t have access to the mr lumbar spine. Will repeat CT of the head to look at the focus in the brain in 6 months. Discussed PFO, follow with primary care.  Discussed her use of Ambien, to reports have shown association with Parkinson's disease, at this point will change to clonazepam which is also used for REM sleep disorder.  Addendum 06/03/2018: DAT Scan: IMPRESSION:Loss of dopamine transport activity in the bilateral striata is a pattern typical of Parkinson's syndrome pathology  Addendum 05/20/2018: Patient with reported low back pain with radiculopathy of right leg, right arm and leg weakness and sensory changes, diffuse pain. Reviewed workup with patient today. EMG/NCS within normal limits. She still has episodes of leg tremoring but she is feeling better and if she tells herself to stop she can stop it. She also still feels symptomatic in her right arm and right leg. But symptoms happen separately so may not be related. She went to physical therapy and she loves it. She  is going to start Pilates. Reviewed MRI of the brain and thoracic imaging and labwork to date with patient which is extensive (see below). She has already has MRI cervical spine and lumbar spine at [facility]. Her worst symptoms are the tingling and numbness in the prox arm and legs and the tremor. She asks me for a refill of Ambien, I will give her one refill of ambien but I do not agree with long-term use so she will have to see her primary care to discuss.  Needs a referral to a sleep counselor.  Patient needs cognitive behavioral therapy for insomnia. Please refer to [facility]  MRI brain: This MRI of the brain with and without contrast shows the following:  1.   Brain parenchyma appears normal before and after contrast. 2.   7 to 8 mm focus in the right skull.  Surrounding skull appears normal.   This appears to be benign. She should repeat CT head in 6 months to a year to follow right skull focus, explained she should review with pcp and ask her to order as she may not need follow up in neurology. At this time she is looking for a new pcp and understands it is her responsibility to follow up on this. MRI Thoracic Spine: This MRI of the thoracic spine with and without contrast shows the following: 1.   The spinal cord appears normal. 2.   Small disc protrusions at T7-T8, T8-T9 and T10-T11 that do not cause nerve root compression or spinal stenosis. 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 Showed a small pfo. Emailed Dr. Rayann Heman, no intervention needed.  Parkinsonism?: Pending DAT scan  Normal labs: Vit D, MMA, Lyme, tsh, HIV, esr, sjogrens, pan-anca, b12, folate, rpr, help c, paraneoplatic abs, celiac antibodies, heavy metals, b6, IFE/SPEP, copper, b1, cmo, cbc, ana   HPI:  AVERIL DIGMAN is a 64 y.o. female here as requested by Dr. Erline Levine for low back pain.  She has been seen by [place] [facility] and EMG nerve conduction study of the right upper extremity did not show any etiology for her symptoms. She has been extensively evaluated at [place] [facility] with MRI c-spine and MRI L-spine and emg/ncs of the right arm. She has seen rheumatology. Here with her husband who provides much information. About 2 years she started feeling tired and lethargic, she was feeling joint pain diffuse. Then she started having right thumb pain and saw rheumatologist who allayed her fears about rheumatologic disease, diagnosed with hyper joints. She  has had neck issues under her should blade on her right side and tingles and radiates down the right arm to the thumb. Massage and heat helps. She has some tremor in the right arm. Also when she sits on the toilet her right leg tremors. Also her handwriting is affected. She has weakness in the right hand, her hand goes numb, she can hardly write. She has weakness in the right hand. No problems with smell or taste, but he appetite has decreased and taste has changed per husband. No drooling or wet pillows at night. She has imbalance. No REM sleep disorder. No falls. She denies incontinence, She mumbles a lot this is not new but husband has hearing difficulties, no hypophonia. Husband has noticed some shuffling. No changes in facial expressions. She has back pain and radiation into the right leg. Numbness in the right leg. No other focal neurologic deficits, associated symptoms, inciting events or modifiable factors.  Reviewed notes, labs and  imaging from outside physicians, which showed:  Reviewed notes from Dr. Vertell Limber in his office neurosurgery and spine.:  Patient has been experiencing right upper extremity neck pain and also numbness and tingling in digits 1 and 2.  Diminished dexterity in the right hand.  Past medical history is otherwise noncontributory.  She has right shoulder right arm and right some numbness and tingling.  Numbness is exacerbated by use.  She denies any neck pain.  She also notes some right leg numbness and tingling that begins in her right head and extends into her right outer toes.  She is noticed balance issues and handwriting issues over the last several months.  She also reports tremors in her right hand.  Her rheumatologist diagnosed hyperflexible right thumb and elevate her concern for autoimmune process.  On exam her neurologic exam is significant for decreased sensory in the right C6 distribution, Spurling's positive on the right, Tinel's positive on the right, Phalen's positive  on the right.  She does have 4 out of 5 right hand intrinsics weakness and decreased pin sensitivity in the first and second digits on the right otherwise negative straight leg raise negative right sciatic notch discomfort.  Reviewed MRI of the cervical spine report I do not have imaging.  Impression is L4-L5 bilateral facet Orth osteoarthritis allowing 2 mm of anterolisthesis.  Mild bulging of the disc.  No compressive stenosis, the back arthritis would be a cause of back pain or referred pain.  Chronic appearing degenerative changes of the discs at L3-L4 and L5-S1 but without herniation.  No stenosis or neural compression.  Exam date January 27, 2018.  MRI of the cervical spine reviewed report I do not have the images available: No significant degenerative changes, no stenosis or neural compression, no abnormality seen to explain neck pain and right arm symptoms.  T2 bright focus within the anterior right T1 vertebral body which may be a benign cyst would recommend, suggest a contrast-enhanced examination to rule out enhancing marrow space lesion.  Labs include normal thyroid panel, BUN 16 and creatinine 0.58 collected 03/25/2017, ANA negative, rheumatoid arthritis negative, sed rate negative, CEA normal, CA normal CBC was unremarkable April 11, 2017 except for mildly low platelets at 136.  Reviewed data and results of electrodiagnostic testing summary includes right upper extremity was within normal limits with no evidence for cervical radiculopathy, brachial plexopathy, peripheral median or ulnar neuropathy as well as polyneuropathy.  Per the note she could be experiencing some cervical radiculitis is not manifesting on electrodiagnostic testing, recommended MRI cervical spine.  Also recommended possibly repeating in the future.  Due to right arm numbness right arm weakness and right arm pain.     Review of Systems: Patient complains of symptoms per HPI as well as the following symptoms: numbness,  weakness, insomnia, leg pain, tremor, aching muscles . Pertinent negatives and positives per HPI. All others negative.   Social History   Socioeconomic History  . Marital status: Married    Spouse name: Not on file  . Number of children: 3  . Years of education: Not on file  . Highest education level: Bachelor's degree (e.g., BA, AB, BS)  Occupational History  . Not on file  Tobacco Use  . Smoking status: Never Smoker  . Smokeless tobacco: Never Used  Substance and Sexual Activity  . Alcohol use: Yes    Alcohol/week: 3.0 standard drinks    Types: 3 Standard drinks or equivalent per week  . Drug use: Yes  Types: Marijuana    Comment: "not often"  . Sexual activity: Not on file  Other Topics Concern  . Not on file  Social History Narrative   Lives at home with husband   Right handed   Caffeine: 1/2 cup "maybe" daily   Social Determinants of Health   Financial Resource Strain:   . Difficulty of Paying Living Expenses: Not on file  Food Insecurity:   . Worried About Charity fundraiser in the Last Year: Not on file  . Ran Out of Food in the Last Year: Not on file  Transportation Needs:   . Lack of Transportation (Medical): Not on file  . Lack of Transportation (Non-Medical): Not on file  Physical Activity:   . Days of Exercise per Week: Not on file  . Minutes of Exercise per Session: Not on file  Stress:   . Feeling of Stress : Not on file  Social Connections:   . Frequency of Communication with Friends and Family: Not on file  . Frequency of Social Gatherings with Friends and Family: Not on file  . Attends Religious Services: Not on file  . Active Member of Clubs or Organizations: Not on file  . Attends Archivist Meetings: Not on file  . Marital Status: Not on file  Intimate Partner Violence:   . Fear of Current or Ex-Partner: Not on file  . Emotionally Abused: Not on file  . Physically Abused: Not on file  . Sexually Abused: Not on file    Family  History  Problem Relation Age of Onset  . Cancer Mother   . Cancer Father   . Cancer Brother   . Breast cancer Neg Hx   . Parkinson's disease Neg Hx     Past Medical History:  Diagnosis Date  . GAD (generalized anxiety disorder)   . Osteoporosis   . Parkinson disease [facility])     Past Surgical History:  Procedure Laterality Date  . ABDOMINAL HYSTERECTOMY    . AUGMENTATION MAMMAPLASTY Bilateral 2016, 1998   Patient had them redone in 2016  . COSMETIC SURGERY    . face lift    . NOSE SURGERY     cosmetic    Current Outpatient Medications  Medication Sig Dispense Refill  . ALPRAZolam (XANAX) 0.5 MG tablet Take 1 tablet (0.5 mg total) by mouth 3 (three) times daily as needed. 60 tablet 2  . Biotin w/ Vitamins C & E (HAIR/SKIN/NAILS PO) Take by mouth.    . Calcium Carb-Cholecalciferol (CALCIUM + D3 PO) Take 500 mg by mouth daily.    . Cholecalciferol (VITAMIN D3 PO) Take 2,000 Int'l Units by mouth daily.    Marland Kitchen Fexofenadine-Pseudoephedrine (ALLEGRA-D 12 HOUR PO) Take 1 tablet by mouth daily.     Marland Kitchen FLUTICASONE PROPIONATE NA Place 1 spray into both nostrils at bedtime.    . gabapentin (NEURONTIN) 300 MG capsule Take 1 capsule (300 mg total) by mouth 3 (three) times daily as needed. 270 capsule 4  . ketotifen (ZADITOR) 0.025 % ophthalmic solution Place 1 drop into both eyes as needed.    Marland Kitchen KRILL OIL PO Take 500 mg by mouth daily.    Marland Kitchen MAGNESIUM PO Take by mouth daily.     . Minoxidil (ROGAINE EX) Apply 2 drops topically daily.    . mirtazapine (REMERON) 7.5 MG tablet Take 1 tablet (7.5 mg total) by mouth at bedtime. 90 tablet 6  . montelukast (SINGULAIR) 10 MG tablet Take 10 mg by mouth  at bedtime.    . Multiple Vitamin (MULTIVITAMIN) tablet Take 1 tablet by mouth daily.    . ondansetron (ZOFRAN ODT) 4 MG disintegrating tablet Take 1 tablet (4 mg total) by mouth every 8 (eight) hours as needed for nausea or vomiting. 6 tablet 0  . Potassium Gluconate 550 MG TABS Take 1 tablet by mouth  daily.    . Probiotic Product (PROBIOTIC PO) Take by mouth.    . Ropinirole HCl 6 MG TB24 Take 1 tablet (6 mg total) by mouth at bedtime. 30 tablet 6  . Wheat Dextrin (BENEFIBER PO) Take by mouth.    . ALPRAZolam (XANAX) 0.5 MG tablet Take 0.5 mg by mouth as needed.     No current facility-administered medications for this visit.    Allergies as of 05/18/2019 - Review Complete 05/18/2019  Allergen Reaction Noted  . Augmentin [amoxicillin-pot clavulanate] Hives 03/02/2018  . Perflutren lipid microsphere Other (See Comments) 03/24/2018  . Neupro [rotigotine] Rash 05/18/2019    Vitals: BP 101/67 (BP Location: Left Arm, Patient Position: Sitting)   Pulse 69   Temp (!) 96.8 F (36 C) Comment: taken at front  Ht 5' 10.5" (1.791 m)   Wt 129 lb (58.5 kg)   BMI 18.25 kg/m  Last Weight:  Wt Readings from Last 1 Encounters:  05/18/19 129 lb (58.5 kg)   Last Height:   Ht Readings from Last 1 Encounters:  05/18/19 5' 10.5" (1.791 m)   Physical exam: Exam: Gen: NAD, conversant, well nourised, thin,, well groomed                     CV: RRR, +SEM. No Carotid Bruits. No peripheral edema, warm, nontender Eyes: Conjunctivae clear without exudates or hemorrhage MSK: tall, thin, thin and long fingers, joint laxity  Neuro: Detailed Neurologic Exam  Speech:    Speech is normal; fluent and spontaneous with normal comprehension.  Cognition:    The patient is oriented to person, place, and time;     recent and remote memory intact;     language fluent;     normal attention, concentration,     fund of knowledge Cranial Nerves: Hypomimia    The pupils are equal, round, and reactive to light. The fundi are normal and spontaneous venous pulsations are present. Visual fields are full to finger confrontation. Extraocular movements are intact. Trigeminal sensation is intact and the muscles of mastication are normal. Bilateral ptosis. The palate elevates in the midline. Hearing intact. Voice is  normal. Shoulder shrug is normal. The tongue has normal motion without fasciculations.   Coordination:    Normal finger to nose and heel to shin. Normal rapid alternating movements.   Gait:    Heel-toe and tandem gait are normal.  Decreased right arm swing.   Motor Observation:    Postural tremor Tone:    Normal muscle tone.   Posture:    Posture is normal. normal erect    Strength: Mild generalized weakness more proximaly 4+/5 in the deltoids and hip flexors    Strength is V/V in the upper and lower limbs.      Sensation: intact to LT     Reflex Exam:  DTR's: right biceps slightly increased as compared to the left.  Brisk lowers for age.     Toes:    The toes are downgoing bilaterally.   Clonus:    2 beats clonus at AJs        Assessment/Plan:  64 year old  female with multiple symptoms including chronic neck and back pain, radicular symptoms, fatigue, lethargy, diffuse pain, hyer joints, neck pain muscular, tremors, worsening weakness in right arm and hand and numbness, imbalance, hypomimia, decreased arm swing, numbnes sin right leg. She has already been extensively evaluated without causes found.  Extensive evaluation including MRI of the brain, MRI of the cervical spine, DaTscan revealed Parkinson's disease.  - Doing well on Requip, likes Neupro but skin rash made it intolerable  - Refill meds  - Discussed skin checks  - Patient would like to be referred to a movement disorder specialist clinic and when appropriate they would like DBS, will refer to [facility] Dr. Donna Christen team.  - CT scan of the head with/w ocntrast to follow up on the skull lesion seen on MRI head. Stable.   - 6  months in the office for follow-up   PRIOR:   - Discussed her use of Ambien, to reports have shown association with Parkinson's disease, at this point will change to gabapentin/mirtazapine may help with anxiety too.  - Lumbar radiculopathy: feels better with exercise, f/u with Dr.  Maryjean Ka if needed, she canceled her last shot bc she was feeling better.  Patient has lumbar radiculopathy, has seen Dr. Vertell Limber in the past for this, requesting epidural steroid injections or evaluation and treatment as clinically warranted with pain procedures.  Orders Placed This Encounter  Procedures  . Ambulatory referral to Neurology     Addendum 06/03/2018: DAT Scan: IMPRESSION:Loss of dopamine transport activity in the bilateral striata is a pattern typical of Parkinson's syndrome pathology  Addendum 05/20/2018: Patient with reported low back pain with radiculopathy of right leg, right arm and leg weakness and sensory changes, diffuse pain. Reviewed workup with patient today. EMG/NCS within normal limits. She still has episodes of leg tremoring but she is feeling better and if she tells herself to stop she can stop it. She also still feels symptomatic in her right arm and right leg. But symptoms happen separately so may not be related. She went to physical therapy and she loves it. She is going to start Pilates. Reviewed MRI of the brain and thoracic imaging and labwork to date with patient which is extensive (see below). She has already has MRI cervical spine and lumbar spine at [facility]. Her worst symptoms are the tingling and numbness in the prox arm and legs and the tremor. She asks me for a refill of Ambien, I will give her one refill of ambien but I do not agree with long-term use so she will have to see her primary care to discuss. Needs a referral to a sleep counselor.  - Patient did well on Neupro but could not tolerate had rashes, on oral dopamine agonists. - Patient would like to be referred to a movement disorder specialist clinic and when appropriate they would like DBS, will refer to [facility] Dr. Donna Christen team.   MRI brain: This MRI of the brain with and without contrast shows the following:  1.   Brain parenchyma appears normal before and after contrast. 2.   7 to 8 mm  focus in the right skull.  Surrounding skull appears normal.   This appears to be benign. She should repeat CT head in 6 months to a year to follow right skull focus, explained she should review with pcp and ask her to order as she may not need follow up in neurology. At this time she is looking for a new pcp and understands it  is her responsibility to follow up on this. MRI Thoracic Spine: This MRI of the thoracic spine with and without contrast shows the following: 1.   The spinal cord appears normal. 2.   Small disc protrusions at T7-T8, T8-T9 and T10-T11 that do not cause nerve root compression or spinal stenosis. 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 Showed a small pfo. Emailed Dr. Rayann Heman, no intervention needed.  Normal labs: Vit D, MMA, Lyme, tsh, HIV, esr, sjogrens, pan-anca, b12, folate, rpr, help c, paraneoplatic abs, celiac antibodies, heavy metals, b6, IFE/SPEP, copper, b1, cmo, cbc, ana  Initial Assessment and plan 03/02/2018:  - Need to evaluate for MS given diffuse symptoms. MRI brain and cervical spine w/wo contrast - MRI of the brain to evaluate for MS, stroke, masses or other lesions causing her focal neurologic symptoms. Also MRI cervical and thoracic spine w/wo contrast to evaluate for  T2 bright focus within the anterior right T1 vertebral body which may be a benign cyst but suggest a contrast-enhanced examination to rule out enhancing marrow space lesion or other lesions. - Right rhomboid and trap tightness, heat and massage feels good. Dry needling for cervical myofascial pain - EMG/NCS of the right arm and right leg - Phenotypically she appears Marfan-like?: echocardiogram and f/u with pcp to discuss referral if clinically warranted and possibly genetic testing - Consider DAT scan, parkinsonism on exam - Tremor: may be essential tremor? MRI brain and cervical spine - Extensive lab testing today - Physical therapy for right-sided and pain and gait abnormality and  proximal weakness: Mild generalized weakness more proximaly 4+/5 in the deltoids and hip flexors - A wrist splint to support right wrist and right thumb. - Xray of right hand  Orders Placed This Encounter  Procedures  . Ambulatory referral to Neurology    Cc: [name], MD, Dr. Erline Levine  A total of 45 minutes was spent face-to-face with this patient. Over half this time was spent on counseling patient on the  1. Parkinson's disease ([place])    diagnosis and different diagnostic and therapeutic options, counseling and coordination of care, risks ans benefits of management, compliance, or risk factor reduction and education.     Sarina Ill, MD  [facility] [address] [place], [place] [address]  Phone [phone] Fax [phone]

## 2019-05-24 ENCOUNTER — Telehealth: Payer: Self-pay | Admitting: Neurology

## 2019-05-24 NOTE — Telephone Encounter (Signed)
Patient called stating that she believes she was referred to the wrong neurologist office as she was sent to a clinic in [place] and she resides here in [place] please follow up

## 2019-05-30 NOTE — Telephone Encounter (Signed)
Resent to [facility] For Dr. Linus Mako . [facility] will call patient to schedule. Patient is aware [phone] - [phone]

## 2019-06-01 IMAGING — MG DIGITAL DIAGNOSTIC UNILATERAL RIGHT MAMMOGRAM WITH TOMO AND CAD
4 series · 4 of 12 positions shown · non-contrast
Comparison: Previous exam(s).

CLINICAL DATA: Screening recall for a possible asymmetry in the
right breast.

EXAM:
DIGITAL DIAGNOSTIC RIGHT MAMMOGRAM WITH CAD AND TOMO
ULTRASOUND RIGHT BREAST

[R MLO synth-2D]
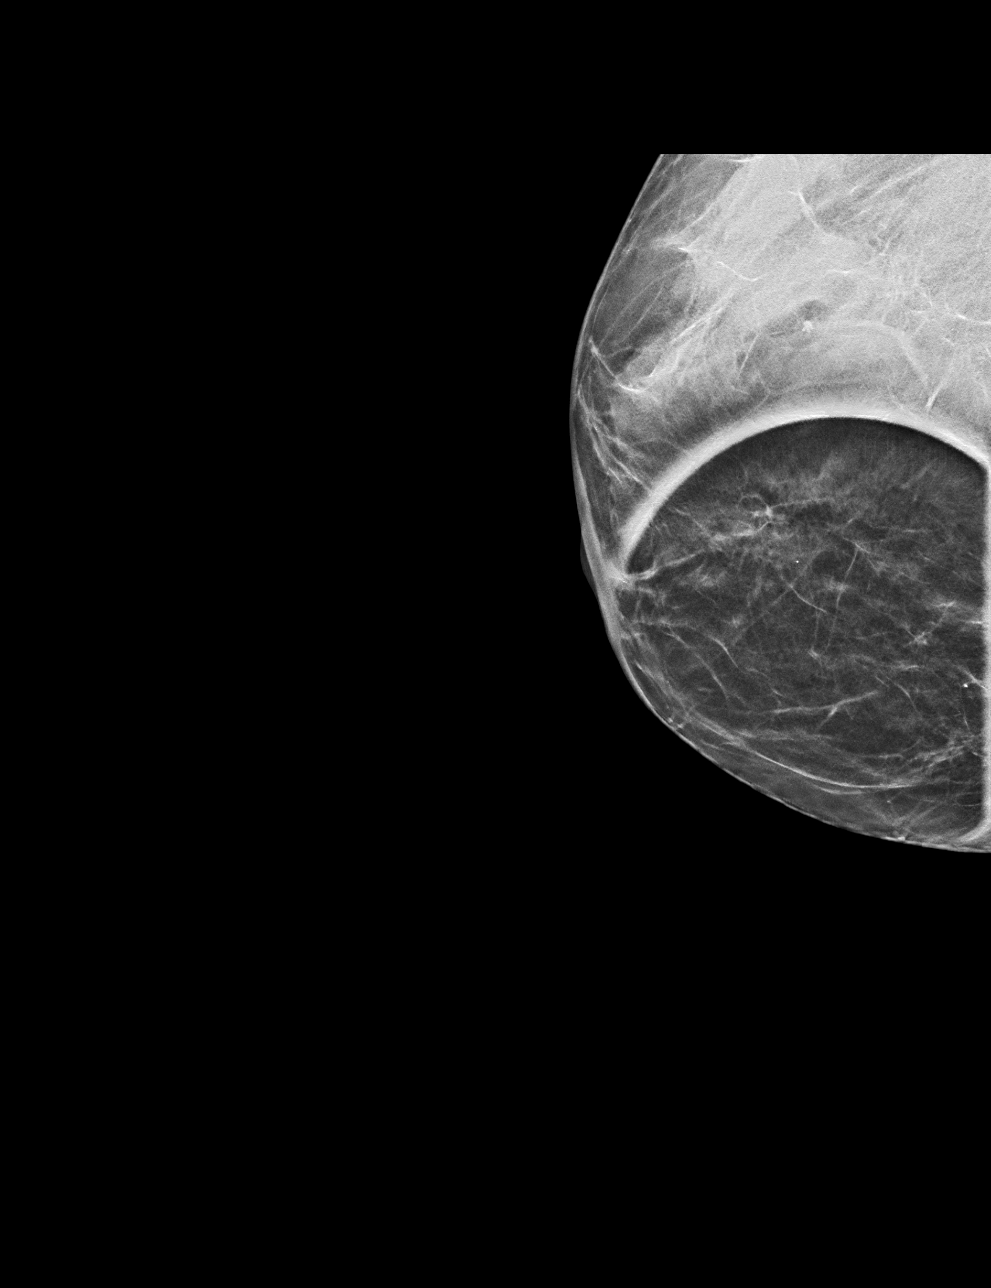

[R CC synth-2D]
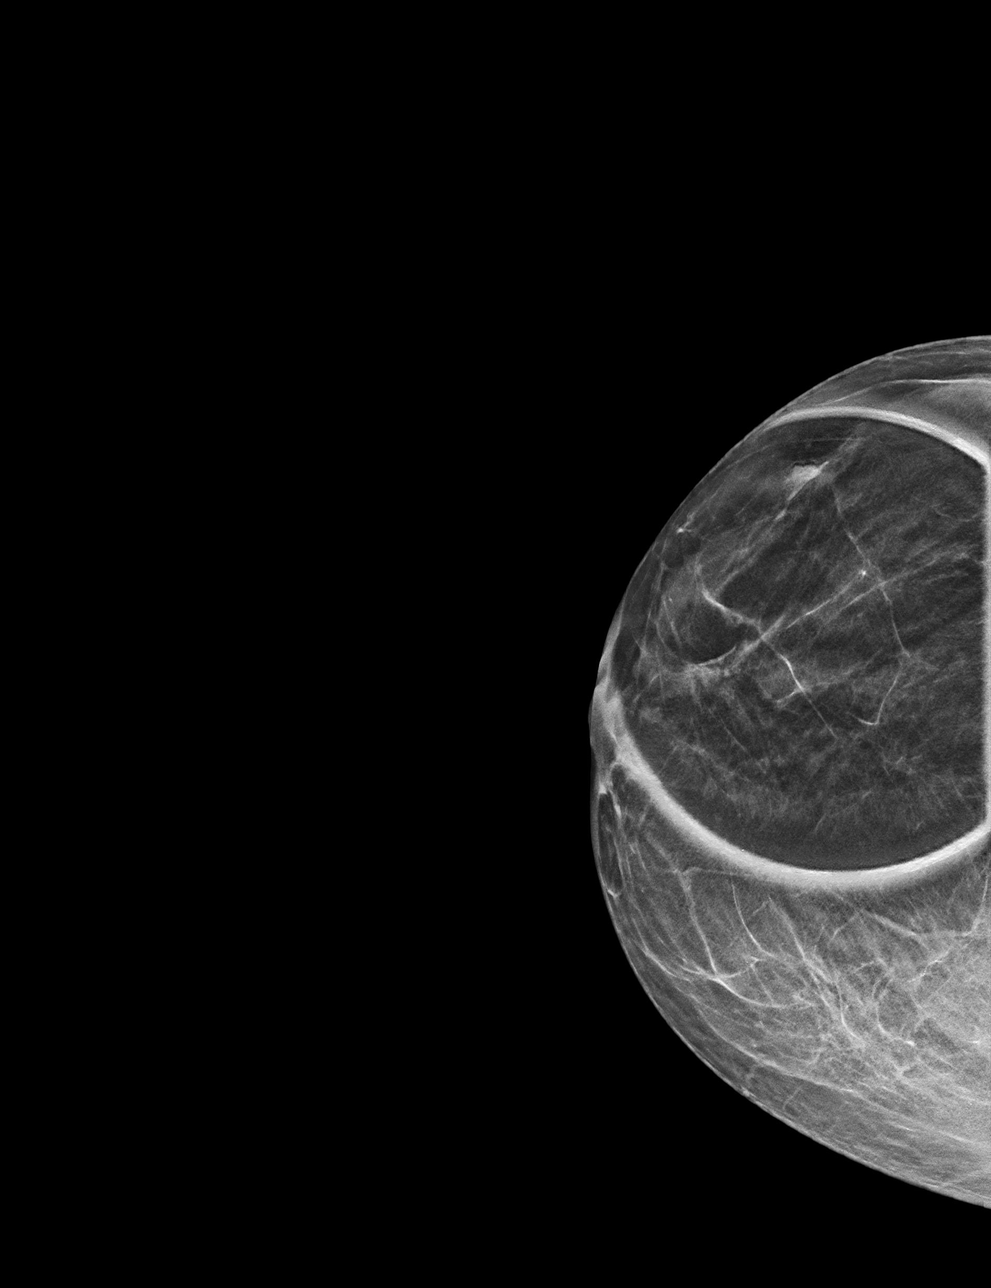

[R MLO tomo · tomo slice 15/30.0]
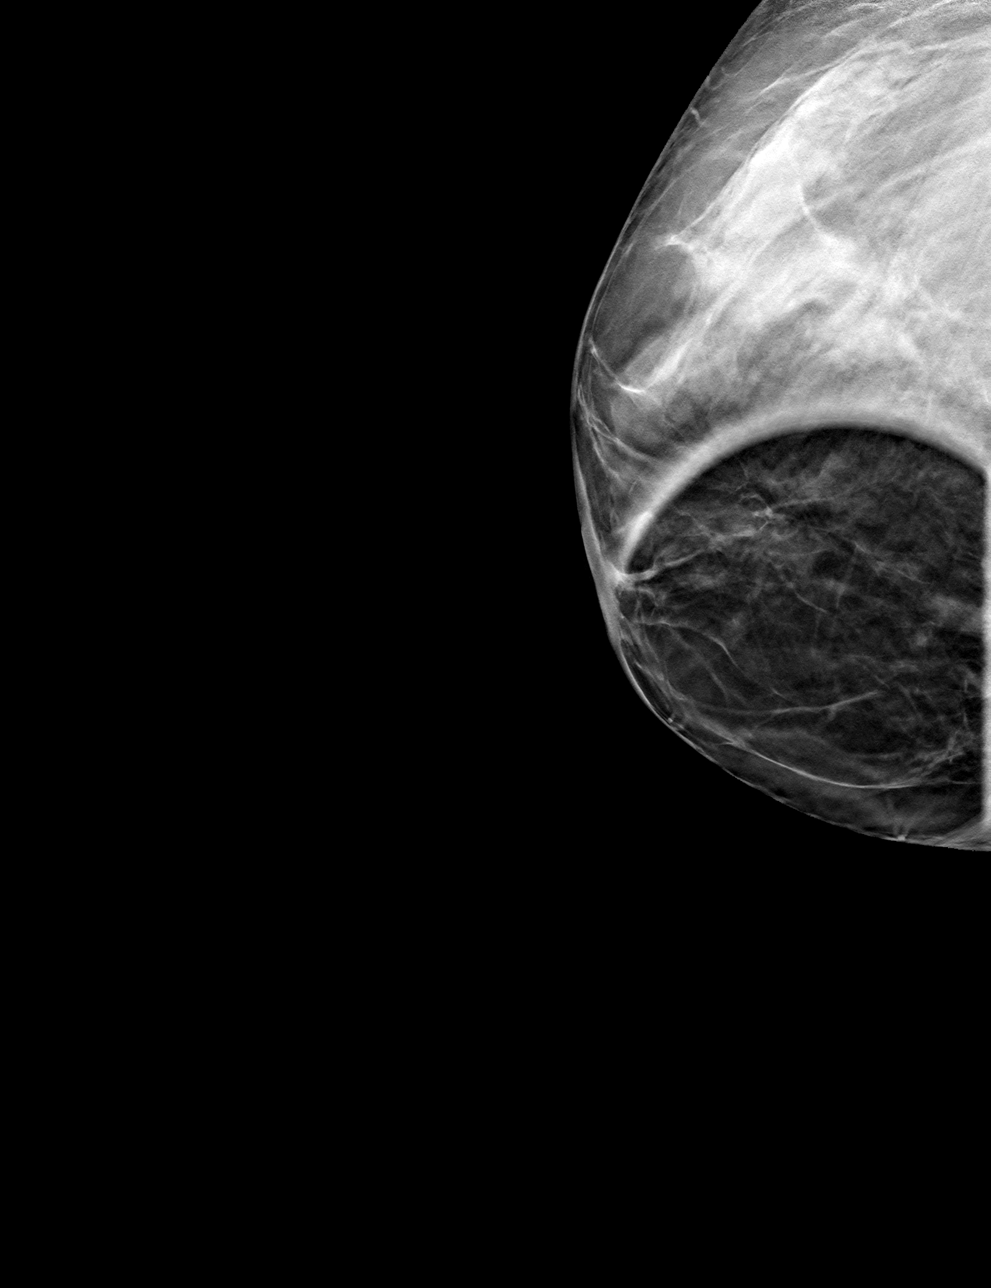

[R CC tomo · tomo slice 17/32.0]
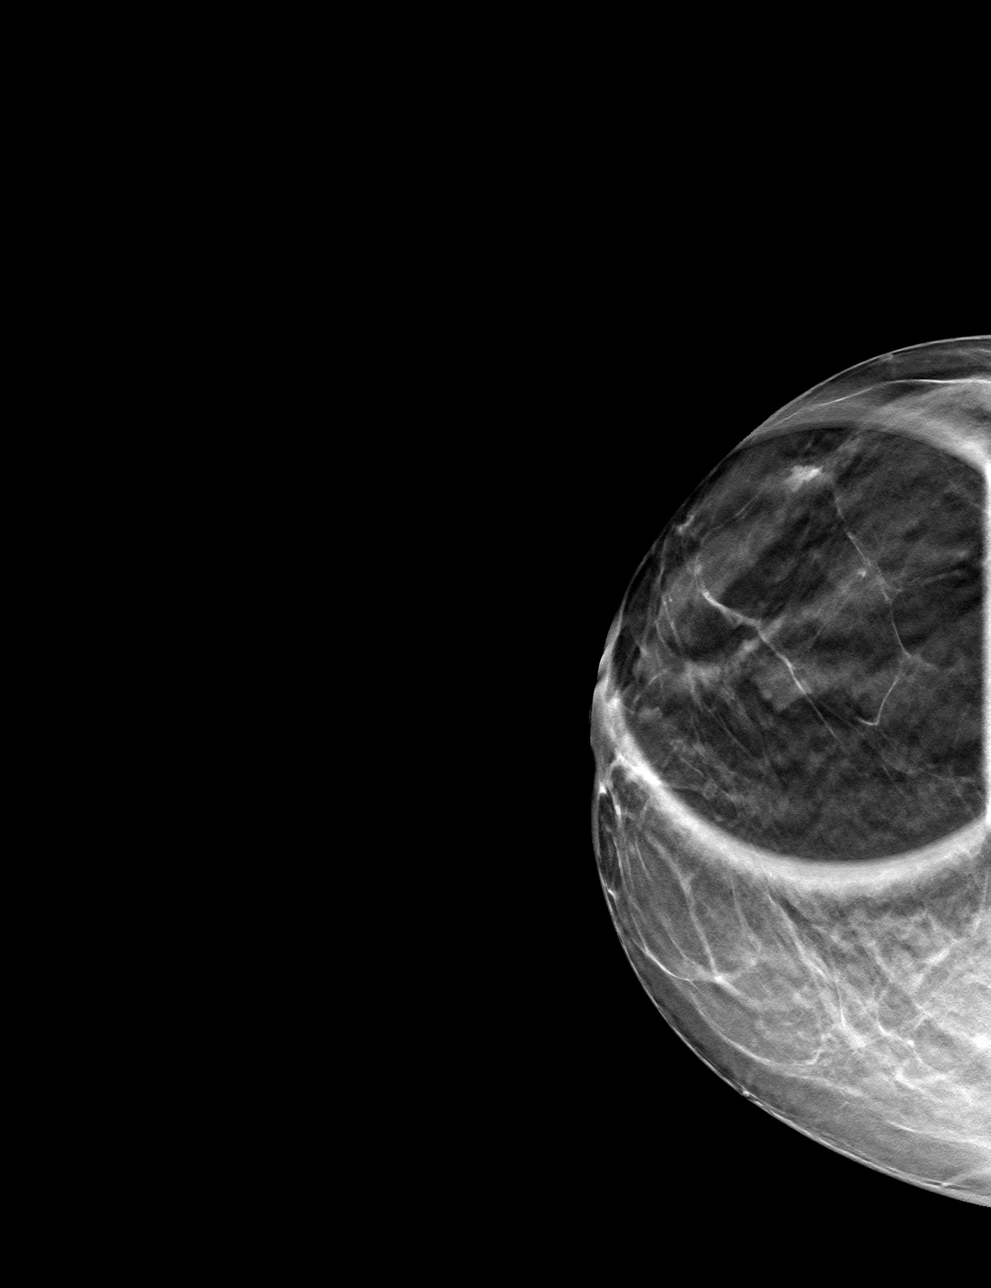

[4 of 12 positions shown; findings below may reference images not displayed]

ACR Breast Density Category b: There are scattered areas of
fibroglandular density.
FINDINGS: The area possible asymmetry disperses on the spot compression images
consistent with superimposed fibroglandular tissue. There is no
significant residual asymmetry. There are no discrete masses or
areas of architectural distortion.

Mammographic images were processed with CAD.

On physical exam, no mass is palpated along the lateral aspect of
the right breast.

Targeted ultrasound is performed, showing normal tissue throughout
the lateral right breast. No mass or suspicious lesion.
IMPRESSION: No evidence of breast malignancy.

RECOMMENDATION:
Screening mammogram in one year.(Code:[id number])

I have discussed the findings and recommendations with the patient.
Results were also provided in writing at the conclusion of the
visit. If applicable, a reminder letter will be sent to the patient
regarding the next appointment.

BI-RADS CATEGORY  1: Negative.

## 2019-07-06 ENCOUNTER — Ambulatory Visit: Payer: 59

## 2019-07-19 ENCOUNTER — Other Ambulatory Visit: Payer: Self-pay

## 2019-07-20 ENCOUNTER — Ambulatory Visit (INDEPENDENT_AMBULATORY_CARE_PROVIDER_SITE_OTHER): Payer: 59 | Admitting: Internal Medicine

## 2019-07-20 ENCOUNTER — Encounter: Payer: Self-pay | Admitting: Internal Medicine

## 2019-07-20 VITALS — BP 102/68 | HR 77 | Temp 98.3°F | Wt 128.5 lb

## 2019-07-20 DIAGNOSIS — J302 Other seasonal allergic rhinitis: Secondary | ICD-10-CM

## 2019-07-20 DIAGNOSIS — G20 Parkinson's disease: Secondary | ICD-10-CM

## 2019-07-20 NOTE — Patient Instructions (Signed)
-  Nice seeing you today!!  -Schedule follow up in 3-6 months for your physical. Please come in fasting.

## 2019-07-20 NOTE — Progress Notes (Signed)
New Patient Office Visit     This visit occurred during the SARS-CoV-2 public health emergency.  Safety protocols were in place, including screening questions prior to the visit, additional usage of staff PPE, and extensive cleaning of exam room while observing appropriate contact time as indicated for disinfecting solutions.    CC/Reason for Visit: Establish care, discuss chronic conditions Previous PCP: None Last Visit: Unknown  HPI: Caitlin Braun is a 64 y.o. female who is coming in today for the above mentioned reasons. She moved to [place] 9 years ago, has not felt the need to establish care with a PCP as she had been traveling on a yearly basis to [place] for this purpose. Last year she had some right lower extremity tremors and was diagnosed with Parkinson's disease. In addition traveling to [place] has become difficult because of the pandemic and as such she has decided to establish care with a primary in the area. Her past medical history is only significant for her recently diagnosed Parkinson's disease and is followed by Dr. Jaynee Eagles and seasonal allergies. 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 She is married, has 3 children and 2 grandchildren. She is a never smoker, drinks alcohol occasionally, amoxicillin causes hives. Her past surgical history is significant for rhinoplasty and breast implants. Family history significant for a father and a brother with lung cancer mother with leiomyosarcoma. She has received both of her Covid vaccines. Her GYN is Dr. Helane Rima who follows her Pap smears. She is up-to-date with her mammograms, she had a colonoscopy in 2019 and she does every 2 to 3 years. She has no complaints today.  Past Medical/Surgical History: Past Medical History:  Diagnosis Date  . GAD (generalized anxiety disorder)   . Osteoporosis   . Parkinson disease [facility])     Past Surgical History:  Procedure Laterality Date    . ABDOMINAL HYSTERECTOMY    . AUGMENTATION MAMMAPLASTY Bilateral 2016, 1998   Patient had them redone in 2016  . COSMETIC SURGERY    . face lift    . NOSE SURGERY     cosmetic    Social History:  reports that she has never smoked. She has never used smokeless tobacco. She reports current alcohol use of about 3.0 standard drinks of alcohol per week. She reports current drug use. Drug: Marijuana.  Allergies: Allergies  Allergen Reactions  . Augmentin [Amoxicillin-Pot Clavulanate] Hives  . Perflutren Lipid Microsphere Other (See Comments)    Back pain  . Neupro [Rotigotine] Rash    Allergic to the adhesive     Family History:  Family History  Problem Relation Age of Onset  . Cancer Mother   . Cancer Father   . Cancer Brother   . Breast cancer Neg Hx   . Parkinson's disease Neg Hx      Current Outpatient Medications:  .  ALPRAZolam (XANAX) 0.5 MG tablet, Take 0.5 mg by mouth as needed., Disp: , Rfl:  .  Biotin w/ Vitamins C & E (HAIR/SKIN/NAILS PO), Take by mouth., Disp: , Rfl:  .  Calcium Carb-Cholecalciferol (CALCIUM + D3 PO), Take 500 mg by mouth daily., Disp: , Rfl:  .  Cholecalciferol (VITAMIN D3 PO), Take 2,000 Int'l Units by mouth daily., Disp: , Rfl:  .  Coenzyme Q10 (CO Q-10) 100 MG CAPS, Take by mouth., Disp: , Rfl:  .  Fexofenadine-Pseudoephedrine (ALLEGRA-D 12 HOUR PO), Take 1 tablet by mouth daily. , Disp: ,  Rfl:  .  FLUTICASONE PROPIONATE NA, Place 1 spray into both nostrils at bedtime., Disp: , Rfl:  .  gabapentin (NEURONTIN) 300 MG capsule, Take 1 capsule (300 mg total) by mouth 3 (three) times daily as needed., Disp: 270 capsule, Rfl: 4 .  ketotifen (ZADITOR) 0.025 % ophthalmic solution, Place 1 drop into both eyes as needed., Disp: , Rfl:  .  KRILL OIL PO, Take 500 mg by mouth daily., Disp: , Rfl:  .  MAGNESIUM PO, Take by mouth daily. , Disp: , Rfl:  .  Minoxidil (ROGAINE EX), Apply 2 drops topically daily., Disp: , Rfl:  .  mirtazapine (REMERON) 7.5 MG  tablet, Take 1 tablet (7.5 mg total) by mouth at bedtime., Disp: 90 tablet, Rfl: 6 .  montelukast (SINGULAIR) 10 MG tablet, Take 10 mg by mouth at bedtime., Disp: , Rfl:  .  Multiple Vitamin (MULTIVITAMIN) tablet, Take 1 tablet by mouth daily., Disp: , Rfl:  .  Potassium Gluconate 550 MG TABS, Take 1 tablet by mouth daily., Disp: , Rfl:  .  Ropinirole HCl 6 MG TB24, Take 1 tablet (6 mg total) by mouth at bedtime., Disp: 30 tablet, Rfl: 6 .  Wheat Dextrin (BENEFIBER PO), Take by mouth., Disp: , Rfl:   Review of Systems:  Constitutional: Denies fever, chills, diaphoresis, appetite change and fatigue.  HEENT: Denies photophobia, eye pain, redness, hearing loss, ear pain, congestion, sore throat, rhinorrhea, sneezing, mouth sores, trouble swallowing, neck pain, neck stiffness and tinnitus.   Respiratory: Denies SOB, DOE, cough, chest tightness,  and wheezing.   Cardiovascular: Denies chest pain, palpitations and leg swelling.  Gastrointestinal: Denies nausea, vomiting, abdominal pain, diarrhea, constipation, blood in stool and abdominal distention.  Genitourinary: Denies dysuria, urgency, frequency, hematuria, flank pain and difficulty urinating.  Endocrine: Denies: hot or cold intolerance, sweats, changes in hair or nails, polyuria, polydipsia. Musculoskeletal: Denies myalgias, back pain, joint swelling, arthralgias and gait problem.  Skin: Denies pallor, rash and wound.  Neurological: Denies dizziness, seizures, syncope, weakness, light-headedness, numbness and headaches.  Hematological: Denies adenopathy. Easy bruising, personal or family bleeding history  Psychiatric/Behavioral: Denies suicidal ideation, mood changes, confusion, nervousness, sleep disturbance and agitation    Physical Exam: Vitals:   07/20/19 1107  BP: 102/68  Pulse: 77  Temp: 98.3 F (36.8 C)  TempSrc: Temporal  SpO2: 97%  Weight: 128 lb 8 oz (58.3 kg)   Body mass index is 18.18 kg/m.  Constitutional: NAD, calm,  comfortable Eyes: PERRL, lids and conjunctivae normal ENMT: Mucous membranes are moist.  Respiratory: clear to auscultation bilaterally, no wheezing, no crackles. Normal respiratory effort. No accessory muscle use.  Cardiovascular: Regular rate and rhythm, no murmurs / rubs / gallops. No extremity edema.  Neurologic: Grossly intact and nonfocal Psychiatric: Normal judgment and insight. Alert and oriented x 3. Normal mood.    Impression and Plan:  Parkinson's disease ([place]) -Appears stable, follows with local neurologist.  Seasonal allergies -Continue Singulair, Allegra, Flonase, follow-up with allergist as scheduled    Patient Instructions  -Nice seeing you today!!  -Schedule follow up in 3-6 months for your physical. Please come in fasting.       Lelon Frohlich, MD [facility]

## 2019-09-06 ENCOUNTER — Other Ambulatory Visit: Payer: Self-pay

## 2019-09-07 ENCOUNTER — Encounter: Payer: Self-pay | Admitting: Internal Medicine

## 2019-09-07 ENCOUNTER — Ambulatory Visit (INDEPENDENT_AMBULATORY_CARE_PROVIDER_SITE_OTHER): Payer: 59 | Admitting: Internal Medicine

## 2019-09-07 VITALS — BP 98/64 | HR 74 | Temp 97.3°F | Ht 68.0 in | Wt 128.5 lb

## 2019-09-07 DIAGNOSIS — G20 Parkinson's disease: Secondary | ICD-10-CM | POA: Diagnosis not present

## 2019-09-07 DIAGNOSIS — Z0000 Encounter for general adult medical examination without abnormal findings: Secondary | ICD-10-CM | POA: Diagnosis not present

## 2019-09-07 DIAGNOSIS — G4700 Insomnia, unspecified: Secondary | ICD-10-CM | POA: Diagnosis not present

## 2019-09-07 LAB — LIPID PANEL
Cholesterol: 135 mg/dL (ref 0–200)
HDL: 38.1 mg/dL — ABNORMAL LOW (ref >39.00)
LDL Cholesterol: 79 mg/dL (ref 0–99)
NonHDL: 96.8
Total CHOL/HDL Ratio: 4
Triglycerides: 90 mg/dL (ref 0.0–149.0)
VLDL: 18 mg/dL (ref 0.0–40.0)

## 2019-09-07 LAB — COMPREHENSIVE METABOLIC PANEL
ALT: 13 U/L (ref 0–35)
AST: 16 U/L (ref 0–37)
Albumin: 4.6 g/dL (ref 3.5–5.2)
Alkaline Phosphatase: 60 U/L (ref 39–117)
BUN: 21 mg/dL (ref 6–23)
CO2: 31 mEq/L (ref 19–32)
Calcium: 9.2 mg/dL (ref 8.4–10.5)
Chloride: 103 mEq/L (ref 96–112)
Creatinine, Ser: 0.57 mg/dL (ref 0.40–1.20)
GFR: 106.91 mL/min (ref >60.00)
Glucose, Bld: 86 mg/dL (ref 70–99)
Potassium: 4.1 mEq/L (ref 3.5–5.1)
Sodium: 139 mEq/L (ref 135–145)
Total Bilirubin: 1.1 mg/dL (ref 0.2–1.2)
Total Protein: 7 g/dL (ref 6.0–8.3)

## 2019-09-07 LAB — CBC WITH DIFFERENTIAL/PLATELET
Basophils Absolute: 0 10*3/uL (ref 0.0–0.1)
Basophils Relative: 0.3 % (ref 0.0–3.0)
Eosinophils Absolute: 0 10*3/uL (ref 0.0–0.7)
Eosinophils Relative: 0.9 % (ref 0.0–5.0)
HCT: 35.7 % — ABNORMAL LOW (ref 36.0–46.0)
Hemoglobin: 11.9 g/dL — ABNORMAL LOW (ref 12.0–15.0)
Lymphocytes Relative: 24.7 % (ref 12.0–46.0)
Lymphs Abs: 1.2 10*3/uL (ref 0.7–4.0)
MCHC: 33.2 g/dL (ref 30.0–36.0)
MCV: 88.2 fl (ref 78.0–100.0)
Monocytes Absolute: 0.4 10*3/uL (ref 0.1–1.0)
Monocytes Relative: 8.5 % (ref 3.0–12.0)
Neutro Abs: 3.2 10*3/uL (ref 1.4–7.7)
Neutrophils Relative %: 65.6 % (ref 43.0–77.0)
Platelets: 114 10*3/uL — ABNORMAL LOW (ref 150.0–400.0)
RBC: 4.05 Mil/uL (ref 3.87–5.11)
RDW: 14.3 % (ref 11.5–15.5)
WBC: 4.9 10*3/uL (ref 4.0–10.5)

## 2019-09-07 LAB — HEMOGLOBIN A1C: Hgb A1c MFr Bld: 5.7 % (ref 4.6–6.5)

## 2019-09-07 LAB — VITAMIN D 25 HYDROXY (VIT D DEFICIENCY, FRACTURES): VITD: 53.22 ng/mL (ref 30.00–100.00)

## 2019-09-07 LAB — TSH: TSH: 2.78 u[IU]/mL (ref 0.35–4.50)

## 2019-09-07 LAB — VITAMIN B12: Vitamin B-12: 616 pg/mL (ref 211–911)

## 2019-09-07 MED ORDER — ALPRAZOLAM 0.5 MG PO TABS: 0.5000 mg | ORAL_TABLET | ORAL | 1 refills | Status: DC | PRN

## 2019-09-07 NOTE — Patient Instructions (Signed)
-Nice seeing you today!!  -Lab work today; will notify you once results are available.  -See you back in 1 year or sooner as needed.   Preventive Care 55-64 Years Old, Female Preventive care refers to visits with your health care provider and lifestyle choices that can promote health and wellness. This includes:  A yearly physical exam. This may also be called an annual well check.  Regular dental visits and eye exams.  Immunizations.  Screening for certain conditions.  Healthy lifestyle choices, such as eating a healthy diet, getting regular exercise, not using drugs or products that contain nicotine and tobacco, and limiting alcohol use. What can I expect for my preventive care visit? Physical exam Your health care provider will check your:  Height and weight. This may be used to calculate body mass index (BMI), which tells if you are at a healthy weight.  Heart rate and blood pressure.  Skin for abnormal spots. Counseling Your health care provider may ask you questions about your:  Alcohol, tobacco, and drug use.  Emotional well-being.  Home and relationship well-being.  Sexual activity.  Eating habits.  Work and work Statistician.  Method of birth control.  Menstrual cycle.  Pregnancy history. What immunizations do I need?  Influenza (flu) vaccine  This is recommended every year. Tetanus, diphtheria, and pertussis (Tdap) vaccine  You may need a Td booster every 10 years. Varicella (chickenpox) vaccine  You may need this if you have not been vaccinated. Zoster (shingles) vaccine  You may need this after age 84. Measles, mumps, and rubella (MMR) vaccine  You may need at least one dose of MMR if you were born in 1957 or later. You may also need a second dose. Pneumococcal conjugate (PCV13) vaccine  You may need this if you have certain conditions and were not previously vaccinated. Pneumococcal polysaccharide (PPSV23) vaccine  You may need one or  two doses if you smoke cigarettes or if you have certain conditions. Meningococcal conjugate (MenACWY) vaccine  You may need this if you have certain conditions. Hepatitis A vaccine  You may need this if you have certain conditions or if you travel or work in places where you may be exposed to hepatitis A. Hepatitis B vaccine  You may need this if you have certain conditions or if you travel or work in places where you may be exposed to hepatitis B. Haemophilus influenzae type b (Hib) vaccine  You may need this if you have certain conditions. Human papillomavirus (HPV) vaccine  If recommended by your health care provider, you may need three doses over 6 months. You may receive vaccines as individual doses or as more than one vaccine together in one shot (combination vaccines). Talk with your health care provider about the risks and benefits of combination vaccines. What tests do I need? Blood tests  Lipid and cholesterol levels. These may be checked every 5 years, or more frequently if you are over 84 years old.  Hepatitis C test.  Hepatitis B test. Screening  Lung cancer screening. You may have this screening every year starting at age 64 if you have a 30-pack-year history of smoking and currently smoke or have quit within the past 15 years.  Colorectal cancer screening. All adults should have this screening starting at age 41 and continuing until age 83. Your health care provider may recommend screening at age 36 if you are at increased risk. You will have tests every 1-10 years, depending on your results and the type  of screening test.  Diabetes screening. This is done by checking your blood sugar (glucose) after you have not eaten for a while (fasting). You may have this done every 1-3 years.  Mammogram. This may be done every 1-2 years. Talk with your health care provider about when you should start having regular mammograms. This may depend on whether you have a family history  of breast cancer.  BRCA-related cancer screening. This may be done if you have a family history of breast, ovarian, tubal, or peritoneal cancers.  Pelvic exam and Pap test. This may be done every 3 years starting at age 21. Starting at age 30, this may be done every 5 years if you have a Pap test in combination with an HPV test. Other tests  Sexually transmitted disease (STD) testing.  Bone density scan. This is done to screen for osteoporosis. You may have this scan if you are at high risk for osteoporosis. Follow these instructions at home: Eating and drinking  Eat a diet that includes fresh fruits and vegetables, whole grains, lean protein, and low-fat dairy.  Take vitamin and mineral supplements as recommended by your health care provider.  Do not drink alcohol if: ? Your health care provider tells you not to drink. ? You are pregnant, may be pregnant, or are planning to become pregnant.  If you drink alcohol: ? Limit how much you have to 0-1 drink a day. ? Be aware of how much alcohol is in your drink. In the [place] one drink equals one 12 oz bottle of beer (355 mL), one 5 oz glass of wine (148 mL), or one 1 oz glass of hard liquor (44 mL). Lifestyle  Take daily care of your teeth and gums.  Stay active. Exercise for at least 30 minutes on 5 or more days each week.  Do not use any products that contain nicotine or tobacco, such as cigarettes, e-cigarettes, and chewing tobacco. If you need help quitting, ask your health care provider.  If you are sexually active, practice safe sex. Use a condom or other form of birth control (contraception) in order to prevent pregnancy and STIs (sexually transmitted infections).  If told by your health care provider, take low-dose aspirin daily starting at age 50. What's next?  Visit your health care provider once a year for a well check visit.  Ask your health care provider how often you should have your eyes and teeth checked.  Stay up  to date on all vaccines. This information is not intended to replace advice given to you by your health care provider. Make sure you discuss any questions you have with your health care provider. Document Revised: 12/03/2017 Document Reviewed: 12/03/2017 [employer] Patient Education  2020 [employer]  

## 2019-09-07 NOTE — Progress Notes (Signed)
Established Patient Office Visit     This visit occurred during the SARS-CoV-2 public health emergency.  Safety protocols were in place, including screening questions prior to the visit, additional usage of staff PPE, and extensive cleaning of exam room while observing appropriate contact time as indicated for disinfecting solutions.    CC/Reason for Visit: Annual preventive exam  HPI: Caitlin Braun is a 64 y.o. female who is coming in today for the above mentioned reasons. Past Medical History is significant for: Parkinson's disease followed by neurology.  She is requesting refills for Xanax that she uses for insomnia.  Since I last saw her she had her Covid vaccines, she saw GYN for her well woman visit in May, Pap smear was done and also a DEXA scan that showed osteoporosis.  She has a follow-up with GYN again this week to discuss treatment of osteoporosis.  She had a mammogram in 2020 and a colonoscopy in 2019.  She has routine eye and dental care.   Past Medical/Surgical History: Past Medical History:  Diagnosis Date   GAD (generalized anxiety disorder)    Osteoporosis    Parkinson disease ([place])     Past Surgical History:  Procedure Laterality Date   ABDOMINAL HYSTERECTOMY     AUGMENTATION MAMMAPLASTY Bilateral 2016, 1998   Patient had them redone in 2016   COSMETIC SURGERY     face lift     NOSE SURGERY     cosmetic    Social History:  reports that she has never smoked. She has never used smokeless tobacco. She reports current alcohol use of about 3.0 standard drinks of alcohol per week. She reports current drug use. Drug: Marijuana.  Allergies: Allergies  Allergen Reactions   Augmentin [Amoxicillin-Pot Clavulanate] Hives   Perflutren Lipid Microsphere Other (See Comments)    Back pain   Neupro [Rotigotine] Rash    Allergic to the adhesive     Family History:  Family History  Problem Relation Age of Onset   Cancer Mother    Cancer Father     Cancer Brother    Breast cancer Neg Hx    Parkinson's disease Neg Hx      Current Outpatient Medications:    ALPRAZolam (XANAX) 0.5 MG tablet, Take 1 tablet (0.5 mg total) by mouth as needed., Disp: 90 tablet, Rfl: 1   Biotin w/ Vitamins C & E (HAIR/SKIN/NAILS PO), Take by mouth., Disp: , Rfl:    Calcium Carb-Cholecalciferol (CALCIUM + D3 PO), Take 500 mg by mouth daily., Disp: , Rfl:    Cholecalciferol (VITAMIN D3 PO), Take 2,000 Int'l Units by mouth daily., Disp: , Rfl:    Coenzyme Q10 (CO Q-10) 100 MG CAPS, Take by mouth., Disp: , Rfl:    Fexofenadine-Pseudoephedrine (ALLEGRA-D 12 HOUR PO), Take 1 tablet by mouth daily. , Disp: , Rfl:    FLUTICASONE PROPIONATE NA, Place 1 spray into both nostrils at bedtime., Disp: , Rfl:    gabapentin (NEURONTIN) 300 MG capsule, Take 1 capsule (300 mg total) by mouth 3 (three) times daily as needed., Disp: 270 capsule, Rfl: 4   ketotifen (ZADITOR) 0.025 % ophthalmic solution, Place 1 drop into both eyes as needed., Disp: , Rfl:    KRILL OIL PO, Take 500 mg by mouth daily., Disp: , Rfl:    MAGNESIUM PO, Take by mouth daily. , Disp: , Rfl:    Minoxidil (ROGAINE EX), Apply 2 drops topically daily., Disp: , Rfl:    mirtazapine (  REMERON) 7.5 MG tablet, Take 1 tablet (7.5 mg total) by mouth at bedtime., Disp: 90 tablet, Rfl: 6   montelukast (SINGULAIR) 10 MG tablet, Take 10 mg by mouth at bedtime., Disp: , Rfl:    Multiple Vitamin (MULTIVITAMIN) tablet, Take 1 tablet by mouth daily., Disp: , Rfl:    Potassium Gluconate 550 MG TABS, Take 1 tablet by mouth daily., Disp: , Rfl:    rOPINIRole (REQUIP XL) 8 MG 24 hr tablet, Take 8 mg by mouth at bedtime., Disp: , Rfl:    Wheat Dextrin (BENEFIBER PO), Take by mouth., Disp: , Rfl:   Review of Systems:  Constitutional: Denies fever, chills, diaphoresis, appetite change and fatigue.  HEENT: Denies photophobia, eye pain, redness, hearing loss, ear pain, congestion, sore throat, rhinorrhea,  sneezing, mouth sores, trouble swallowing, neck pain, neck stiffness and tinnitus.   Respiratory: Denies SOB, DOE, cough, chest tightness,  and wheezing.   Cardiovascular: Denies chest pain, palpitations and leg swelling.  Gastrointestinal: Denies nausea, vomiting, abdominal pain, diarrhea, constipation, blood in stool and abdominal distention.  Genitourinary: Denies dysuria, urgency, frequency, hematuria, flank pain and difficulty urinating.  Endocrine: Denies: hot or cold intolerance, sweats, changes in hair or nails, polyuria, polydipsia. Musculoskeletal: Denies myalgias, back pain, joint swelling, arthralgias and gait problem.  Skin: Denies pallor, rash and wound.  Neurological: Denies dizziness, seizures, syncope, weakness, light-headedness, numbness and headaches.  Hematological: Denies adenopathy. Easy bruising, personal or family bleeding history  Psychiatric/Behavioral: Denies suicidal ideation, mood changes, confusion, nervousness, sleep disturbance and agitation    Physical Exam: Vitals:   09/07/19 0946  BP: 98/64  Pulse: 74  Temp: (!) 97.3 F (36.3 C)  TempSrc: Temporal  SpO2: 96%  Weight: 128 lb 8 oz (58.3 kg)  Height: 5' 8" (1.727 m)    Body mass index is 19.54 kg/m.   Constitutional: NAD, calm, comfortable Eyes: PERRL, lids and conjunctivae normal ENMT: Mucous membranes are moist. Tympanic membrane is pearly white, no erythema or bulging. Neck: normal, supple, no masses, no thyromegaly Respiratory: clear to auscultation bilaterally, no wheezing, no crackles. Normal respiratory effort. No accessory muscle use.  Cardiovascular: Regular rate and rhythm, no murmurs / rubs / gallops. No extremity edema.  Abdomen: no tenderness, no masses palpated. No hepatosplenomegaly. Bowel sounds positive.  Musculoskeletal: no clubbing / cyanosis. No joint deformity upper and lower extremities. Good ROM, no contractures. Normal muscle tone.  Skin: no rashes, lesions, ulcers. No  induration Neurologic: CN 2-12 grossly intact. Sensation intact, DTR normal. Strength 5/5 in all 4.  Psychiatric: Normal judgment and insight. Alert and oriented x 3. Normal mood.    Impression and Plan:  Encounter for preventive health examination  -She has routine eye and dental care. -All immunizations are up-to-date and age-appropriate. -Screening labs today. -Healthy lifestyle discussed in detail. -She had a colonoscopy in 2019 and is a 3 to 5-year follow-up due to family history of colon cancer. -She had a mammogram in July 2020. -She had her Pap smear in May 21.  Parkinson's disease ([place]) -Followed by neurology.  Insomnia, unspecified type  - Plan: ALPRAZolam Duanne Moron) 0.5 MG tablet   Patient Instructions  -Nice seeing you today!!  -Lab work today; will notify you once results are available.  -See you back in 1 year or sooner as needed.   Preventive Care 8-49 Years Old, Female Preventive care refers to visits with your health care provider and lifestyle choices that can promote health and wellness. This includes:  A yearly physical exam. This  may also be called an annual well check.  Regular dental visits and eye exams.  Immunizations.  Screening for certain conditions.  Healthy lifestyle choices, such as eating a healthy diet, getting regular exercise, not using drugs or products that contain nicotine and tobacco, and limiting alcohol use. What can I expect for my preventive care visit? Physical exam Your health care provider will check your:  Height and weight. This may be used to calculate body mass index (BMI), which tells if you are at a healthy weight.  Heart rate and blood pressure.  Skin for abnormal spots. Counseling Your health care provider may ask you questions about your:  Alcohol, tobacco, and drug use.  Emotional well-being.  Home and relationship well-being.  Sexual activity.  Eating habits.  Work and work Statistician.  Method  of birth control.  Menstrual cycle.  Pregnancy history. What immunizations do I need?  Influenza (flu) vaccine  This is recommended every year. Tetanus, diphtheria, and pertussis (Tdap) vaccine  You may need a Td booster every 10 years. Varicella (chickenpox) vaccine  You may need this if you have not been vaccinated. Zoster (shingles) vaccine  You may need this after age 70. Measles, mumps, and rubella (MMR) vaccine  You may need at least one dose of MMR if you were born in 1957 or later. You may also need a second dose. Pneumococcal conjugate (PCV13) vaccine  You may need this if you have certain conditions and were not previously vaccinated. Pneumococcal polysaccharide (PPSV23) vaccine  You may need one or two doses if you smoke cigarettes or if you have certain conditions. Meningococcal conjugate (MenACWY) vaccine  You may need this if you have certain conditions. Hepatitis A vaccine  You may need this if you have certain conditions or if you travel or work in places where you may be exposed to hepatitis A. Hepatitis B vaccine  You may need this if you have certain conditions or if you travel or work in places where you may be exposed to hepatitis B. Haemophilus influenzae type b (Hib) vaccine  You may need this if you have certain conditions. Human papillomavirus (HPV) vaccine  If recommended by your health care provider, you may need three doses over 6 months. You may receive vaccines as individual doses or as more than one vaccine together in one shot (combination vaccines). Talk with your health care provider about the risks and benefits of combination vaccines. What tests do I need? Blood tests  Lipid and cholesterol levels. These may be checked every 5 years, or more frequently if you are over 36 years old.  Hepatitis C test.  Hepatitis B test. Screening  Lung cancer screening. You may have this screening every year starting at age 45 if you have a  30-pack-year history of smoking and currently smoke or have quit within the past 15 years.  Colorectal cancer screening. All adults should have this screening starting at age 53 and continuing until age 63. Your health care provider may recommend screening at age 48 if you are at increased risk. You will have tests every 1-10 years, depending on your results and the type of screening test.  Diabetes screening. This is done by checking your blood sugar (glucose) after you have not eaten for a while (fasting). You may have this done every 1-3 years.  Mammogram. This may be done every 1-2 years. Talk with your health care provider about when you should start having regular mammograms. This may depend on whether  you have a family history of breast cancer.  BRCA-related cancer screening. This may be done if you have a family history of breast, ovarian, tubal, or peritoneal cancers.  Pelvic exam and Pap test. This may be done every 3 years starting at age 38. Starting at age 74, this may be done every 5 years if you have a Pap test in combination with an HPV test. Other tests  Sexually transmitted disease (STD) testing.  Bone density scan. This is done to screen for osteoporosis. You may have this scan if you are at high risk for osteoporosis. Follow these instructions at home: Eating and drinking  Eat a diet that includes fresh fruits and vegetables, whole grains, lean protein, and low-fat dairy.  Take vitamin and mineral supplements as recommended by your health care provider.  Do not drink alcohol if: ? Your health care provider tells you not to drink. ? You are pregnant, may be pregnant, or are planning to become pregnant.  If you drink alcohol: ? Limit how much you have to 0-1 drink a day. ? Be aware of how much alcohol is in your drink. In the [place] one drink equals one 12 oz bottle of beer (355 mL), one 5 oz glass of wine (148 mL), or one 1 oz glass of hard liquor (44  mL). Lifestyle  Take daily care of your teeth and gums.  Stay active. Exercise for at least 30 minutes on 5 or more days each week.  Do not use any products that contain nicotine or tobacco, such as cigarettes, e-cigarettes, and chewing tobacco. If you need help quitting, ask your health care provider.  If you are sexually active, practice safe sex. Use a condom or other form of birth control (contraception) in order to prevent pregnancy and STIs (sexually transmitted infections).  If told by your health care provider, take low-dose aspirin daily starting at age 74. What's next?  Visit your health care provider once a year for a well check visit.  Ask your health care provider how often you should have your eyes and teeth checked.  Stay up to date on all vaccines. This information is not intended to replace advice given to you by your health care provider. Make sure you discuss any questions you have with your health care provider. Document Revised: 12/03/2017 Document Reviewed: 12/03/2017 Elsevier Patient Education  2020 [place], MD [facility]

## 2019-09-08 ENCOUNTER — Encounter: Payer: Self-pay | Admitting: Internal Medicine

## 2019-09-20 ENCOUNTER — Other Ambulatory Visit: Payer: Self-pay | Admitting: Obstetrics and Gynecology

## 2019-09-20 DIAGNOSIS — Z1231 Encounter for screening mammogram for malignant neoplasm of breast: Secondary | ICD-10-CM

## 2019-10-01 ENCOUNTER — Other Ambulatory Visit: Payer: Self-pay | Admitting: Neurology

## 2019-10-11 ENCOUNTER — Other Ambulatory Visit: Payer: Self-pay | Admitting: Neurology

## 2019-10-19 ENCOUNTER — Ambulatory Visit: Payer: 59

## 2019-10-31 ENCOUNTER — Telehealth: Payer: Self-pay | Admitting: Internal Medicine

## 2019-10-31 NOTE — Telephone Encounter (Signed)
No longer needed

## 2019-11-03 ENCOUNTER — Other Ambulatory Visit: Payer: Self-pay

## 2019-11-03 ENCOUNTER — Encounter: Payer: Self-pay | Admitting: Internal Medicine

## 2019-11-03 ENCOUNTER — Ambulatory Visit: Payer: 59 | Admitting: Internal Medicine

## 2019-11-03 VITALS — BP 98/64 | HR 82 | Temp 98.0°F | Wt 127.3 lb

## 2019-11-03 DIAGNOSIS — Z7183 Encounter for nonprocreative genetic counseling: Secondary | ICD-10-CM | POA: Diagnosis not present

## 2019-11-03 DIAGNOSIS — M810 Age-related osteoporosis without current pathological fracture: Secondary | ICD-10-CM

## 2019-11-03 NOTE — Progress Notes (Signed)
Established Patient Office Visit     This visit occurred during the SARS-CoV-2 public health emergency.  Safety protocols were in place, including screening questions prior to the visit, additional usage of staff PPE, and extensive cleaning of exam room while observing appropriate contact time as indicated for disinfecting solutions.    CC/Reason for Visit: Discuss osteoporosis treatment and genetic counseling  HPI: Caitlin Braun is a 64 y.o. female who is coming in today for the above mentioned reasons.  She had a DEXA scan in June that reported significant osteoporosis with a T score of -3.5 of the lumbar spine, -3.1 of the left femoral neck and -3.2 at the right femoral neck.  She was advised to start Prolia by her GYN but she wanted to discuss this with me first.  She also brings results from genetic testing that was done at the urging of her neurologist, Dr. Deboraha Sprang, at [facility].  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  All of this testing has been done through [facility], she has already touched base with a genetic counselor there.  She would like to know the next steps in this regard.   Past Medical/Surgical History: Past Medical History:  Diagnosis Date  . GAD (generalized anxiety disorder)   . Osteoporosis   . Parkinson disease [facility])     Past Surgical History:  Procedure Laterality Date  . ABDOMINAL HYSTERECTOMY    . AUGMENTATION MAMMAPLASTY Bilateral 2016, 1998   Patient had them redone in 2016  . COSMETIC SURGERY    . face lift    . NOSE SURGERY     cosmetic    Social History:  reports that she has never smoked. She has never used smokeless tobacco. She reports current alcohol use of about 3.0 standard drinks of alcohol per week. She reports current drug use. Drug: Marijuana.  Allergies: Allergies  Allergen Reactions  . Augmentin [Amoxicillin-Pot Clavulanate] Hives  .  Perflutren Lipid Microsphere Other (See Comments)    Back pain  . Neupro [Rotigotine] Rash    Allergic to the adhesive     Family History:  Family History  Problem Relation Age of Onset  . Cancer Mother   . Cancer Father   . Cancer Brother   . Breast cancer Neg Hx   . Parkinson's disease Neg Hx      Current Outpatient Medications:  .  ALPRAZolam (XANAX) 0.5 MG tablet, Take 1 tablet (0.5 mg total) by mouth as needed., Disp: 90 tablet, Rfl: 1 .  Biotin w/ Vitamins C & E (HAIR/SKIN/NAILS PO), Take by mouth., Disp: , Rfl:  .  Calcium Carb-Cholecalciferol (CALCIUM + D3 PO), Take 500 mg by mouth daily., Disp: , Rfl:  .  Cholecalciferol (VITAMIN D3 PO), Take 2,000 Int'l Units by mouth daily., Disp: , Rfl:  .  Coenzyme Q10 (CO Q-10) 100 MG CAPS, Take by mouth., Disp: , Rfl:  .  Fexofenadine-Pseudoephedrine (ALLEGRA-D 12 HOUR PO), Take 1 tablet by mouth daily. , Disp: , Rfl:  .  FLUTICASONE PROPIONATE NA, Place 1 spray into both nostrils at bedtime., Disp: , Rfl:  .  gabapentin (NEURONTIN) 300 MG capsule, Take 1 capsule (300 mg total) by mouth 3 (three) times daily as needed., Disp: 270 capsule, Rfl: 4 .  ketotifen (ZADITOR) 0.025 % ophthalmic solution, Place 1 drop into both eyes as needed., Disp: , Rfl:  .  KRILL OIL PO,  Take 500 mg by mouth daily., Disp: , Rfl:  .  MAGNESIUM PO, Take by mouth daily. , Disp: , Rfl:  .  Minoxidil (ROGAINE EX), Apply 2 drops topically daily., Disp: , Rfl:  .  mirtazapine (REMERON) 7.5 MG tablet, Take 1 tablet (7.5 mg total) by mouth at bedtime., Disp: 90 tablet, Rfl: 6 .  montelukast (SINGULAIR) 10 MG tablet, Take 10 mg by mouth at bedtime., Disp: , Rfl:  .  Multiple Vitamin (MULTIVITAMIN) tablet, Take 1 tablet by mouth daily., Disp: , Rfl:  .  Potassium Gluconate 550 MG TABS, Take 1 tablet by mouth daily., Disp: , Rfl:  .  rOPINIRole (REQUIP XL) 8 MG 24 hr tablet, Take 8 mg by mouth at bedtime., Disp: , Rfl:  .  Wheat Dextrin (BENEFIBER PO), Take by  mouth., Disp: , Rfl:   Review of Systems:  Constitutional: Denies fever, chills, diaphoresis, appetite change and fatigue.  HEENT: Denies photophobia, eye pain, redness, hearing loss, ear pain, congestion, sore throat, rhinorrhea, sneezing, mouth sores, trouble swallowing, neck pain, neck stiffness and tinnitus.   Respiratory: Denies SOB, DOE, cough, chest tightness,  and wheezing.   Cardiovascular: Denies chest pain, palpitations and leg swelling.  Gastrointestinal: Denies nausea, vomiting, abdominal pain, diarrhea, constipation, blood in stool and abdominal distention.  Genitourinary: Denies dysuria, urgency, frequency, hematuria, flank pain and difficulty urinating.  Endocrine: Denies: hot or cold intolerance, sweats, changes in hair or nails, polyuria, polydipsia. Musculoskeletal: Denies myalgias, back pain, joint swelling, arthralgias and gait problem.  Skin: Denies pallor, rash and wound.  Neurological: Denies dizziness, seizures, syncope, weakness, light-headedness, numbness and headaches.  Hematological: Denies adenopathy. Easy bruising, personal or family bleeding history  Psychiatric/Behavioral: Denies suicidal ideation, mood changes, confusion, nervousness, sleep disturbance and agitation    Physical Exam: Vitals:   11/03/19 1303  BP: (!) 98/64  Pulse: 82  Temp: 98 F (36.7 C)  TempSrc: Oral  SpO2: 97%  Weight: 127 lb 4.8 oz (57.7 kg)    Body mass index is 19.36 kg/m.   Constitutional: NAD, calm, comfortable Eyes: PERRL, lids and conjunctivae normal ENMT: Mucous membranes are moist.  Psychiatric: Normal judgment and insight. Alert and oriented x 3. Normal mood.    Impression and Plan:  Osteoporosis without current pathological fracture, unspecified osteoporosis type -I agree with her GYN about starting Prolia, she prefers to receive it through our office, I will initiate insurance approval.  Encounter for nonprocreative genetic counseling -Unfortunately, I  cannot provide much insight into this topic, have recommended that she contact Dr. Deboraha Sprang for further recommendations.     Lelon Frohlich, MD [facility] at [facility]

## 2019-11-09 ENCOUNTER — Other Ambulatory Visit: Payer: Self-pay

## 2019-11-09 ENCOUNTER — Telehealth: Payer: Self-pay | Admitting: Internal Medicine

## 2019-11-09 ENCOUNTER — Ambulatory Visit
Admission: RE | Admit: 2019-11-09 | Discharge: 2019-11-09 | Disposition: A | Discharge status: Home / Self Care | Payer: 59 | Source: Ambulatory Visit | Attending: Obstetrics and Gynecology | Admitting: Obstetrics and Gynecology

## 2019-11-09 DIAGNOSIS — Z1231 Encounter for screening mammogram for malignant neoplasm of breast: Secondary | ICD-10-CM

## 2019-11-09 NOTE — Telephone Encounter (Signed)
Pt is calling to check the status of her Prolia and stated that she had had her OB-GYN Patria Mane) to run the approval as well but she would like for Dr. Jerilee Hoh to follow her for the Prolia.  Pt would like to have a call back to let her know if it was approved or not.

## 2019-11-10 ENCOUNTER — Encounter: Payer: Self-pay | Admitting: *Deleted

## 2019-11-10 ENCOUNTER — Encounter: Payer: Self-pay | Admitting: Internal Medicine

## 2019-11-10 NOTE — Telephone Encounter (Signed)
Prolia requires a PA and patient's deductible has not yet been met.  1.  Patient should apply for Prolia Support Card  2. When approved PA (Pharmacy PA) will need to be done  3.  Patient will pick up prescription from her local pharmacy  4.  Injection will be admistered at our office.  Patient will have to pay the admin. Fee of about $25  Patient is aware.

## 2019-11-10 NOTE — Telephone Encounter (Signed)
Waiting on insurance verification.  Called [employer] and requested a stat verification.

## 2019-11-15 MED ORDER — DENOSUMAB 60 MG/ML ~~LOC~~ SOSY: 60.0000 mg | PREFILLED_SYRINGE | Freq: Once | SUBCUTANEOUS | 0 refills | Status: AC

## 2019-11-15 NOTE — Progress Notes (Addendum)
[facility]    Provider:  Dr Caitlin Braun  CC:  Parkinson's disease:  Interval history November 15, 2019: Patient here for follow-up of Parkinson's disease.  Patient was seen by the movement disorder team at [facility] in May of this year and I reviewed those notes: She reports symptoms began in 2018 with right lower extremity tremor, right-sided weakness and difficulty with typing and handwriting, she went extensive work-up in 2019 to early 2020 by her PCP, neurosurgery, rheumatology and neurology ultimately diagnosed with Parkinson's disease after a DaTscan was positive, pramipexole made her too tired, Neupro patch helped but she developed a rash and was switched to ropinirole which she was currently taking at this appointment.  She denied rigidity, falls, balance issues, dizziness or gait freezing, speech changes, sialorrhea, choking or swallowing, hallucinations, mood or cognitive concerns or impulsive behaviors.  U PDR S score was 21, Examination showed minimal symmetric bradykinesia, no rigidity and resting right lower extremity tremor consistent with diagnosis of Parkinson's disease supported by her abnormal DaTscan's and response to ropinirole, her ropinirole was increased from 6 mg to 8 mg, gabapentin was continued, melatonin was recommended and mirtazapine was suggested to be decreased but restart if she does notice worsening of anxiety or sleep problems, DBS not recommended at that time but patient be an excellent candidate in the future.  They also discussed genetic testing for Parkinson's disease, daily exercise.  She will follow-up with Dr. Deboraha Braun annually.  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 We discussed follow up with hematology and I sent a message to Dr. Jana Braun regarding patient.    Interval history 05/18/2019: She moves a little slower. She  tremors when she is overtired. We discussed [facility] movement disorders team, we also discussed Education officer, museum at [employer] [facility], they do not feel the social worker will help, Patient would like to be referred to a movement disorder specialist clinic and when appropriate they would like DBS, will refer to [facility] Dr. Donna Braun team. [name] but despite the thngs we tried, we could not resolve her rash on the pastch. We have changed to oral DA and she feels better, she has some swelling and also feels she is a little bloated but tolerating well, she had a colonoscopy and she is fine, discussed association with skin cancers she needs to get a check up regularly. Discussed with husband. She has no REM sleep disorder. She feels stable, sleeping better. Can give her limited xanax.   Interval history 11/15/2018: She has tingling n the back area, scratching helps it. Since she saw me Caitlin Braun, who is a movement disorder specialist. The specialist recommend mirtazapine and Gabapentin. Sleeping is not better. She is on 46m Neuro patch and doing well. Start with the Gabapentin, decrease ambien at night. Also in a few weeks depending on how doing may switch mirtazapine or add it to the regimen but beware of sedation, discussed at length but decrease ambien and woul dlike to get her off of the [place] with studies associating   Interval history Aug 25, 2018: I connected with this lovely patient and her husband today to follow-up on her Parkinson's disease.  She was started on oral dopamine agonists and had a sleep attack she was switched to the Neupro patch which is working quite well for her.  She feels as though she could increase it.  On the 2 mg, felt better. She still has insomnia but that is not new. She feels pressure on her bladder at night, she took peridium and it helped. I recommended she have her urine tested, she went to her pcp and he tested her urine, she had these symptoms prior  to starting the medication and has been working with her pcp. She bought a bike for exercise. Sheis exercising daily for 30 minutes. She is also going to start boxing. She feels she needs an increase to her neupro, will increase to 8m. She has tingling in the leg with radicular appointments. Asked her to fu with stern, I don;t have access to the mr lumbar spine. Will repeat CT of the head to look at the focus in the brain in 6 months. Discussed PFO, follow with primary care.  Discussed her use of Ambien, to reports have shown association with Parkinson's disease, at this point will change to clonazepam which is also used for REM sleep disorder.  Addendum 06/03/2018: DAT Scan: IMPRESSION:Loss of dopamine transport activity in the bilateral striata is a pattern typical of Parkinson's syndrome pathology  Addendum 05/20/2018: Patient with reported low back pain with radiculopathy of right leg, right arm and leg weakness and sensory changes, diffuse pain. Reviewed workup with patient today. EMG/NCS within normal limits. She still has episodes of leg tremoring but she is feeling better and if she tells herself to stop she can stop it. She also still feels symptomatic in her right arm and right leg. But symptoms happen separately so may not be related. She went to physical therapy and she loves it. She is going to start Pilates. Reviewed MRI of the brain and thoracic imaging and labwork to date with patient which is extensive (see below). She has already has [facility] cervical [facility]. Her worst symptoms are the tingling and numbness in the prox arm and legs and the tremor. She asks me for a refill of Ambien, I will give her one refill of ambien but I do not agree with long-term use so she will have to see her primary care to discuss. Needs a referral to a sleep counselor.  Patient needs cognitive behavioral therapy for insomnia. Please refer to [facility]  MRI brain:  This MRI of the brain with and without contrast shows the following:  1.   Brain parenchyma appears normal before and after contrast. 2.   7 to 8 mm focus in the right skull.  Surrounding skull appears normal.   This appears to be benign. She should repeat CT head in 6 months to a year to follow right skull focus, explained she should review with pcp and ask her to order as she may not need follow up in neurology. At this time she is looking for a new pcp and understands it is her responsibility to follow up on this. MRI Thoracic Spine: This MRI of the thoracic spine with and without contrast shows the following: 1.   The spinal cord appears normal. 2.   Small disc protrusions at T7-T8, T8-T9 and T10-T11 that do not cause nerve root compression or spinal stenosis. 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 Showed a small pfo. Emailed Dr. ARayann Heman no intervention needed.  Parkinsonism?: Pending DAT scan  Normal labs: Vit D, MMA, Lyme, tsh, HIV, esr, sjogrens, pan-anca, b12, folate, rpr, help c, paraneoplatic abs, celiac antibodies, heavy metals, b6, IFE/SPEP, copper, b1, cmo, cbc, ana   HPI:  [place]  Terissa Braun is a 64 y.o. female here as requested by Dr. Erline Levine for low back pain.  She has been seen by [place] [facility] and EMG nerve conduction study of the right upper extremity did not show any etiology for her symptoms. She has been extensively evaluated at [facility] with MRI c-spine and MRI L-spine and emg/ncs of the right arm. She has seen rheumatology. Here with her husband who provides much information. About 2 years she started feeling tired and lethargic, she was feeling joint pain diffuse. Then she started having right thumb pain and saw rheumatologist who allayed her fears about rheumatologic disease, diagnosed with hyper joints. She has had neck issues under her should blade on her right side and tingles and radiates down the right arm to the thumb. Massage and heat helps. She  has some tremor in the right arm. Also when she sits on the toilet her right leg tremors. Also her handwriting is affected. She has weakness in the right hand, her hand goes numb, she can hardly write. She has weakness in the right hand. No problems with smell or taste, but he appetite has decreased and taste has changed per husband. No drooling or wet pillows at night. She has imbalance. No REM sleep disorder. No falls. She denies incontinence, She mumbles a lot this is not new but husband has hearing difficulties, no hypophonia. Husband has noticed some shuffling. No changes in facial expressions. She has back pain and radiation into the right leg. Numbness in the right leg. No other focal neurologic deficits, associated symptoms, inciting events or modifiable factors.  Reviewed notes, labs and imaging from outside physicians, which showed:  Reviewed notes from Dr. Vertell Limber in his office neurosurgery and spine.:  Patient has been experiencing right upper extremity neck pain and also numbness and tingling in digits 1 and 2.  Diminished dexterity in the right hand.  Past medical history is otherwise noncontributory.  She has right shoulder right arm and right some numbness and tingling.  Numbness is exacerbated by use.  She denies any neck pain.  She also notes some right leg numbness and tingling that begins in her right head and extends into her right outer toes.  She is noticed balance issues and handwriting issues over the last several months.  She also reports tremors in her right hand.  Her rheumatologist diagnosed hyperflexible right thumb and elevate her concern for autoimmune process.  On exam her neurologic exam is significant for decreased sensory in the right C6 distribution, Spurling's positive on the right, Tinel's positive on the right, Phalen's positive on the right.  She does have 4 out of 5 right hand intrinsics weakness and decreased pin sensitivity in the first and second digits on the right  otherwise negative straight leg raise negative right sciatic notch discomfort.  Reviewed MRI of the cervical spine report I do not have imaging.  Impression is L4-L5 bilateral facet Orth osteoarthritis allowing 2 mm of anterolisthesis.  Mild bulging of the disc.  No compressive stenosis, the back arthritis would be a cause of back pain or referred pain.  Chronic appearing degenerative changes of the discs at L3-L4 and L5-S1 but without herniation.  No stenosis or neural compression.  Exam date January 27, 2018.  MRI of the cervical spine reviewed report I do not have the images available: No significant degenerative changes, no stenosis or neural compression, no abnormality seen to explain neck pain and right arm symptoms.  T2 bright focus within  the anterior right T1 vertebral body which may be a benign cyst would recommend, suggest a contrast-enhanced examination to rule out enhancing marrow space lesion.  Labs include normal thyroid panel, BUN 16 and creatinine 0.58 collected 03/25/2017, ANA negative, rheumatoid arthritis negative, sed rate negative, CEA normal, CA normal CBC was unremarkable April 11, 2017 except for mildly low platelets at 136.  Reviewed data and results of electrodiagnostic testing summary includes right upper extremity was within normal limits with no evidence for cervical radiculopathy, brachial plexopathy, peripheral median or ulnar neuropathy as well as polyneuropathy.  Per the note she could be experiencing some cervical radiculitis is not manifesting on electrodiagnostic testing, recommended MRI cervical spine.  Also recommended possibly repeating in the future.  Due to right arm numbness right arm weakness and right arm pain.     Review of Systems: Patient complains of symptoms per HPI as well as the following symptoms: numbness, weakness, insomnia, leg pain, tremor, aching muscles . Pertinent negatives and positives per HPI. All others negative.   Social History    Socioeconomic History  . Marital status: Married    Spouse name: Not on file  . Number of children: 3  . Years of education: Not on file  . Highest education level: Bachelor's degree (e.g., BA, AB, BS)  Occupational History  . Not on file  Tobacco Use  . Smoking status: Never Smoker  . Smokeless tobacco: Never Used  Vaping Use  . Vaping Use: Never used  Substance and Sexual Activity  . Alcohol use: Yes    Alcohol/week: 3.0 standard drinks    Types: 3 Standard drinks or equivalent per week    Comment: occasional  . Drug use: Yes    Types: Marijuana    Comment: "not often"  . Sexual activity: Not on file  Other Topics Concern  . Not on file  Social History Narrative   Lives at home with husband   Right handed   Caffeine: 1/2 cup "maybe" daily   Social Determinants of Health   Financial Resource Strain:   . Difficulty of Paying Living Expenses:   Food Insecurity:   . Worried About Charity fundraiser in the Last Year:   . Arboriculturist in the Last Year:   Transportation Needs:   . Film/video editor (Medical):   Marland Kitchen Lack of Transportation (Non-Medical):   Physical Activity:   . Days of Exercise per Week:   . Minutes of Exercise per Session:   Stress:   . Feeling of Stress :   Social Connections:   . Frequency of Communication with Friends and Family:   . Frequency of Social Gatherings with Friends and Family:   . Attends Religious Services:   . Active Member of Clubs or Organizations:   . Attends Archivist Meetings:   Marland Kitchen Marital Status:   Intimate Partner Violence:   . Fear of Current or Ex-Partner:   . Emotionally Abused:   Marland Kitchen Physically Abused:   . Sexually Abused:     Family History  Problem Relation Age of Onset  . Cancer Mother   . Cancer Father   . Cancer Brother   . Other Daughter        carrier of Gauchers.  . Breast cancer Neg Hx   . Parkinson's disease Neg Hx     Past Medical History:  Diagnosis Date  . GAD (generalized  anxiety disorder)   . Osteoporosis   . Parkinson disease ([place])  Past Surgical History:  Procedure Laterality Date  . ABDOMINAL HYSTERECTOMY    . AUGMENTATION MAMMAPLASTY Bilateral 2016, 1998   Patient had them redone in 2016  . COSMETIC SURGERY    . face lift    . NOSE SURGERY     cosmetic    Current Outpatient Medications  Medication Sig Dispense Refill  . ALPRAZolam (XANAX) 0.5 MG tablet Take 1 tablet (0.5 mg total) by mouth as needed. 90 tablet 1  . Biotin w/ Vitamins C & E (HAIR/SKIN/NAILS PO) Take by mouth.    . Calcium Carb-Cholecalciferol (CALCIUM + D3 PO) Take 500 mg by mouth daily.    . Cholecalciferol (VITAMIN D3 PO) Take 2,000 Int'l Units by mouth daily.    . Coenzyme Q10 (CO Q-10) 100 MG CAPS Take by mouth.    . Fexofenadine-Pseudoephedrine (ALLEGRA-D 12 HOUR PO) Take 1 tablet by mouth daily.     Marland Kitchen FLUTICASONE PROPIONATE NA Place 1 spray into both nostrils at bedtime.    . gabapentin (NEURONTIN) 300 MG capsule Take 1 capsule (300 mg total) by mouth 3 (three) times daily as needed. 270 capsule 4  . ketotifen (ZADITOR) 0.025 % ophthalmic solution Place 1 drop into both eyes as needed.    Marland Kitchen KRILL OIL PO Take 500 mg by mouth daily.    Marland Kitchen MAGNESIUM PO Take by mouth daily.     . Minoxidil (ROGAINE EX) Apply 2 drops topically daily.    . mirtazapine (REMERON) 7.5 MG tablet Take 1 tablet (7.5 mg total) by mouth at bedtime. 90 tablet 6  . montelukast (SINGULAIR) 10 MG tablet Take 10 mg by mouth at bedtime.    . Multiple Vitamin (MULTIVITAMIN) tablet Take 1 tablet by mouth daily.    . Potassium Gluconate 550 MG TABS Take 1 tablet by mouth daily.    Marland Kitchen rOPINIRole (REQUIP XL) 8 MG 24 hr tablet Take 8 mg by mouth at bedtime.    . Wheat Dextrin (BENEFIBER PO) Take by mouth.     No current facility-administered medications for this visit.    Allergies as of 11/16/2019 - Review Complete 11/16/2019  Allergen Reaction Noted  . Augmentin [amoxicillin-pot clavulanate] Hives  03/02/2018  . Perflutren lipid microsphere Other (See Comments) 03/24/2018  . Neupro [rotigotine] Rash 05/18/2019    Vitals: BP 99/71 (BP Location: Left Arm, Patient Position: Sitting)   Pulse 72   Ht 5' 10.5" (1.791 m)   Wt 127 lb (57.6 kg)   BMI 17.97 kg/m  Last Weight:  Wt Readings from Last 1 Encounters:  11/16/19 127 lb (57.6 kg)   Last Height:   Ht Readings from Last 1 Encounters:  11/16/19 5' 10.5" (1.791 m)   Physical exam: Exam: Gen: NAD, conversant, well nourised, thin,, well groomed                     CV: RRR, +SEM. No Carotid Bruits. No peripheral edema, warm, nontender Eyes: Conjunctivae clear without exudates or hemorrhage MSK: tall, thin, thin and long fingers, joint laxity  Neuro: Detailed Neurologic Exam  Speech:    Speech is normal; fluent and spontaneous with normal comprehension.  Cognition:    The patient is oriented to person, place, and time;     recent and remote memory intact;     language fluent;     normal attention, concentration,     fund of knowledge Cranial Nerves: Hypomimia    The pupils are equal, round, and reactive to light. The fundi  are flat. Visual fields are full to finger confrontation. Extraocular movements are intact. Trigeminal sensation is intact and the muscles of mastication are normal. Bilateral ptosis. The palate elevates in the midline. Hearing intact. Voice is normal. Shoulder shrug is normal. The tongue has normal motion without fasciculations.    Gait:    Decreased right arm swing.   Motor Observation:    Postural tremor, no resting tremor Tone:    Normal muscle tone.   Posture:    Posture is normal. normal erect    Strength: Mild generalized weakness more proximaly 4+/5 in the deltoids and hip flexors, otherwise strength is V/V in the upper and lower limbs.      Sensation: intact to LT     Reflex Exam:  DTR's: right biceps slightly increased as compared to the left.  Brisk lowers for age.     Toes:     The toes are downgoing bilaterally.   Clonus:    2 beats clonus at AJs        Assessment/Plan:  64 year old female with multiple symptoms including chronic neck and back pain, radicular symptoms, fatigue, lethargy, diffuse pain, hyer joints, neck pain muscular, tremors, worsening weakness in right arm and hand and numbness, imbalance, hypomimia, decreased arm swing, numbnes sin right leg. She has already been extensively evaluated without causes found.  Extensive evaluation including MRI of the brain, MRI of the cervical/thoracic/lumbar spine, emg/ncs, lab testing, genetic testing, DaTscan revealed Parkinson's disease.  - Patient was seen at [facility] and genetic testing and discovered she has Gaucher's disease. 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 Will refer to hematology and genetic counseling.  - We had a long discussion about the above, doing well on current medication will continue, will see what hematology and genetic counseling recommend.  - Discussed Gaucher disease, rare genetic disorder, associated conditions. Will refer to the proper specialists, she appears to be asymptomatic but there is treatment in the form of an enzyme supplementation. - Hepatomegaly, maximum coronal span 23 cm from 11/03/2018 will see what Dr. Jana Braun recommends but may need to repeat  - Doing well on Requip, likes Neupro but skin rash made it intolerable  - Refill meds  - Discussed skin checks  - CT scan of the head with/w ocntrast to follow up on the skull lesion seen on MRI head. Stable.   - exercise, continue  - Extensive Parkinson's education provided  - 6  months in the office for follow-up   PRIOR:   - Discussed her use of Ambien, to reports have shown association with Parkinson's disease, at this point will change to gabapentin/mirtazapine may help with anxiety too.  - Lumbar radiculopathy: feels better with exercise, f/u with  Dr. Maryjean Ka if needed, she canceled her last shot bc she was feeling better.  Patient has lumbar radiculopathy, has seen Dr. Vertell Limber in the past for this, requesting epidural steroid injections or evaluation and treatment as clinically warranted with pain procedures.  Per Dr. Jana Braun: "Checking the Gaucher org site, there is one center in [place] [phone]) and one at the [facility] in [place] [phone])--perhaps your patient could start there; we then might be able to play a supportive role   Hope this helps! "  Orders Placed This Encounter  Procedures  . Ambulatory referral to Genetics     Addendum 06/03/2018: DAT Scan: IMPRESSION:Loss of dopamine transport activity in the bilateral striata is a  pattern typical of Parkinson's syndrome pathology  Addendum 05/20/2018: Patient with reported low back pain with radiculopathy of right leg, right arm and leg weakness and sensory changes, diffuse pain. Reviewed workup with patient today. EMG/NCS within normal limits. She still has episodes of leg tremoring but she is feeling better and if she tells herself to stop she can stop it. She also still feels symptomatic in her right arm and right leg. But symptoms happen separately so may not be related. She went to physical therapy and she loves it. She is going to start Pilates. Reviewed MRI of the brain and thoracic imaging and labwork to date with patient which is extensive (see below). She has already has MRI cervical spine and lumbar spine at [facility]. Her worst symptoms are the tingling and numbness in the prox arm and legs and the tremor. She asks me for a refill of Ambien, I will give her one refill of ambien but I do not agree with long-term use so she will have to see her primary care to discuss. Needs a referral to a sleep counselor.  - Patient did well on Neupro but could not tolerate had rashes, now on oral dopamine agonists. - Patient was seen at [facility] and  genetic testing and discovered she has Gaucher's disease. 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 Will refer to hematology and genetic counseling.  - We had a long discussion about the above, doing well on current medication will continue   MRI brain: This MRI of the brain with and without contrast shows the following:  1.   Brain parenchyma appears normal before and after contrast. 2.   7 to 8 mm focus in the right skull.  Surrounding skull appears normal.   This appears to be benign. She should repeat CT head in 6 months to a year to follow right skull focus, explained she should review with pcp and ask her to order as she may not need follow up in neurology. At this time she is looking for a new pcp and understands it is her responsibility to follow up on this. MRI Thoracic Spine: This MRI of the thoracic spine with and without contrast shows the following: 1.   The spinal cord appears normal. 2.   Small disc protrusions at T7-T8, T8-T9 and T10-T11 that do not cause nerve root compression or spinal stenosis. 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 Showed a small pfo. Emailed Dr. Rayann Heman, no intervention needed.  Normal labs: Vit D, MMA, Lyme, tsh, HIV, esr, sjogrens, pan-anca, b12, folate, rpr, help c, paraneoplatic abs, celiac antibodies, heavy metals, b6, IFE/SPEP, copper, b1, cmo, cbc, ana  Initial Assessment and plan 03/02/2018:  - Need to evaluate for MS given diffuse symptoms. MRI brain and cervical spine w/wo contrast - MRI of the brain to evaluate for MS, stroke, masses or other lesions causing her focal neurologic symptoms. Also MRI cervical and thoracic spine w/wo contrast to evaluate for  T2 bright focus within the anterior right T1 vertebral body which may be a benign cyst but suggest a contrast-enhanced examination to rule out enhancing marrow space lesion or other lesions. - Right rhomboid and trap tightness, heat  and massage feels good. Dry needling for cervical myofascial pain - EMG/NCS of the right arm and right leg - Phenotypically she appears Marfan-like?: echocardiogram and f/u with pcp to discuss referral if clinically warranted and possibly genetic testing - Consider DAT  scan, parkinsonism on exam - Tremor: may be essential tremor? MRI brain and cervical spine - Extensive lab testing today - Physical therapy for right-sided and pain and gait abnormality and proximal weakness: Mild generalized weakness more proximaly 4+/5 in the deltoids and hip flexors - A wrist splint to support right wrist and right thumb. - Xray of right hand  Orders Placed This Encounter  Procedures  . Ambulatory referral to Genetics    Cc: [name], MD, Dr. Erline Levine, Dr Caitlin Braun, Dr. Jerilee Hoh  A total of 45 minutes was spent face-to-face with this patient. Over half this time was spent on counseling patient on the  1. 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     Sarina Ill, MD  [facility] [address] [place],  [address]  Phone [phone] Fax [phone]

## 2019-11-16 ENCOUNTER — Ambulatory Visit: Payer: 59 | Admitting: Neurology

## 2019-11-16 ENCOUNTER — Encounter: Payer: Self-pay | Admitting: Neurology

## 2019-11-16 VITALS — BP 99/71 | HR 72 | Ht 70.5 in | Wt 127.0 lb

## 2019-11-16 DIAGNOSIS — G20 Parkinson's disease: Secondary | ICD-10-CM

## 2019-11-16 DIAGNOSIS — E7522 Gaucher disease: Secondary | ICD-10-CM

## 2019-11-16 NOTE — Patient Instructions (Signed)
Genetic counseling Hematology Dr. Jana Hakim   Gaucher Disease Gaucher disease is a rare, inherited disease that causes fatty substances (lipids) to build up in the body. The lipids most often build up in the liver, lungs, bones, and spleen. In severe forms of Gaucher disease, lipids can build up in the brain. Gaucher disease can range from mild to severe. There are three types of the disease:  Type 1 is the most common. It mainly affects the bones, spleen, liver, and lungs, beginning in childhood.  Type 2 is the most severe. It causes life-threatening changes in the brain, usually beginning by 33-64 months of age.  Type 3 affects the same organs as type 1, but it also affects the brain. This type develops more slowly than type 2. What are the causes? Gaucher disease happens because your body does not have enough of a certain type of enzyme (glucocerebrosidase). An enzyme is a body protein that breaks down other body substances. Gaucher disease is an inherited disease. This means it is caused by genes that are passed down through families. To get the disease, the genes must be passed to you by both your mother and your father. What increases the risk? This condition is more likely to occur in people who are of Ashkenazi (central or [place] Jewish ancestry. What are the signs or symptoms? Symptoms vary depending on the type and severity of the disease. Mild forms of the disease may cause very few symptoms. If you have symptoms of Gaucher disease, they may include:  Being tired (fatigued) from a low blood cell count (anemia).  Bone pain.  Bruising.  Bleeding, mainly nosebleeds.  Brittle bones (osteoporosis).  Swelling of the abdomen.  Loss of mental skills.  Being clumsy.  Seizures. People with severe type 2 often do not live long enough to have symptoms. How is this diagnosed? This condition is diagnosed based on your medical history, symptoms, and a physical exam. During  the exam, your health care provider will check to see if your spleen or liver has become larger than normal (enlarged). You may also have tests, including:  Blood tests to check for: ? The abnormal genes that indicate Gaucher disease. ? A very low level of glucocerebrosidase. ? Anemia. ? How well your liver is working.  Imaging tests to check for an enlarged spleen or liver.  Bone X-rays. How is this treated? Treatment for type 1 or type 3 Gaucher disease will depend on the type of disease, the severity of the disease, and your age. In many cases, you can manage type 1 and type 3 Gaucher disease with medicines that break down lipids or keep them from building up. Your health care provider will help you choose a treatment that is right for you. There is no treatment for type 2 Gaucher disease. Follow these instructions at home:  Take over-the-counter and prescription medicines only as told by your health care provider.  Rest as told by your health care provider.  Ask your health care provider what type of exercise is safe for you. You may have limits on how much you can do if your spleen or liver is enlarged.  Make sure you have a good support system at home.  Keep all follow-up visits as told by your health care provider. This is important. Contact a health care provider if:  You have any problems with your medicines.  You have a flare-up of your symptoms.  You have new symptoms. Get help right away if:  You have bleeding that is very heavy or lasts longer than 20 minutes.  You have very bad bone pain.  You have a seizure.  You have chest pain or trouble breathing. This information is not intended to replace advice given to you by your health care provider. Make sure you discuss any questions you have with your health care provider. Document Revised: 03/06/2017 Document Reviewed: 06/19/2015 Elsevier Patient Education  [place].

## 2019-11-17 NOTE — Addendum Note (Signed)
Addended by: Sarina Ill B on: 11/17/2019 10:50 AM   Modules accepted: Orders

## 2019-11-18 ENCOUNTER — Telehealth: Payer: Self-pay | Admitting: Internal Medicine

## 2019-11-18 NOTE — Telephone Encounter (Signed)
error 

## 2019-11-22 ENCOUNTER — Telehealth: Payer: Self-pay | Admitting: Neurology

## 2019-11-22 ENCOUNTER — Telehealth: Payer: Self-pay | Admitting: Internal Medicine

## 2019-11-22 NOTE — Telephone Encounter (Addendum)
Spoke with patient and explained we are going to trying the Prior Auth .  I called [place] at [phone] and was instructed to call   [facility] at [phone] And was instructed to call [place] at [phone].  Prior Auth outcome will come by fax within 4 days.  Message sent to patient's MyChart.

## 2019-11-22 NOTE — Telephone Encounter (Signed)
[facility] Team is reviewing records Telephone [phone] - Fax [phone] . Referral has to Go through a process first this is not a quick turn around time . Review team will call patient to schedule apt . I called relayed message to patient she is aware and she has telephone number.

## 2019-11-22 NOTE — Telephone Encounter (Signed)
Pt would like to have a call back to discuss her prolia.

## 2019-11-24 NOTE — Telephone Encounter (Signed)
Prolia deny by [place] because of the benefit are excluded from coverage.    Spoke with Claiborne Billings from [employer] and she gave information for  [employer] net for patient to apply.  Patient is aware and will call back.

## 2019-11-25 ENCOUNTER — Telehealth: Payer: Self-pay | Admitting: Internal Medicine

## 2019-11-25 NOTE — Telephone Encounter (Signed)
Pt is calling in stating that she would like to know the dosage and how often she should be getting the Prolia, so that she can contact her insurance so that she is able to get it and they pay for it.  Pt would like to have a call back to discuss in detail.

## 2019-11-28 ENCOUNTER — Telehealth: Payer: Self-pay | Admitting: Internal Medicine

## 2019-11-28 NOTE — Telephone Encounter (Signed)
That is fine, Dr. Linus Mako referred her to [facility] so she is ok and we dont need to do anything. thanks

## 2019-11-28 NOTE — Telephone Encounter (Signed)
[place] a Fax Back stating to send patient to [facility] or [facility] . Dr. Jaynee Eagles I have placed she on Bethany's desk . Please advise if you are ok with this plan ? If you are I will call and update patient. Thanks Hinton Dyer.

## 2019-11-28 NOTE — Telephone Encounter (Signed)
Pt would like a call about Prolia  Please call at [phone]

## 2019-11-28 NOTE — Telephone Encounter (Signed)
Noted Thanks Dr.Ahern

## 2019-12-05 ENCOUNTER — Encounter: Payer: Self-pay | Admitting: Internal Medicine

## 2019-12-21 NOTE — Telephone Encounter (Signed)
Levada Dy from [facility] stated the PA for Prolia was denied because it was submitted through pharmacy coverage however it needs to be sent to medical.   Prolia PA needs to be sent to [facility] pharmacy Phone: [phone] Fax: [phone]  Levada Dy stated the pt is very anxious to get this and if this can be submitted today. Informed her that CMA/PCP sees pt in office daily so it depends on when they have a moment to do so and we usually ask for 3-5 business days.

## 2019-12-22 NOTE — Telephone Encounter (Signed)
After multiple calls and informing the patient,  Prolia will not be approved unless the patient tries Fosamax first.  Patient does have a deductible and also qualifies for a savings plan.

## 2019-12-26 ENCOUNTER — Ambulatory Visit (INDEPENDENT_AMBULATORY_CARE_PROVIDER_SITE_OTHER): Payer: 59

## 2019-12-26 ENCOUNTER — Telehealth: Payer: Self-pay | Admitting: Internal Medicine

## 2019-12-26 ENCOUNTER — Other Ambulatory Visit: Payer: Self-pay

## 2019-12-26 DIAGNOSIS — M810 Age-related osteoporosis without current pathological fracture: Secondary | ICD-10-CM

## 2019-12-26 MED ORDER — DENOSUMAB 60 MG/ML ~~LOC~~ SOSY
60.0000 mg | PREFILLED_SYRINGE | Freq: Once | SUBCUTANEOUS | Status: AC
  Administered 2019-12-26: 60 mg via SUBCUTANEOUS

## 2019-12-26 NOTE — Progress Notes (Signed)
Per orders of Dr. Jerilee Hoh, injection of Prolia 60 mg/mL given by Wyvonne Lenz. Patient tolerated injection well.

## 2019-12-26 NOTE — Telephone Encounter (Signed)
Pt is calling in stating that she would like to schedule her next Prolia Injection on 06/26/2019.  It is okay for the pt to be scheduled for that day?

## 2019-12-27 NOTE — Telephone Encounter (Signed)
Okay to schedule

## 2019-12-29 NOTE — Telephone Encounter (Signed)
Pt has been called and scheduled.

## 2020-01-13 ENCOUNTER — Other Ambulatory Visit: Payer: Self-pay | Admitting: Neurology

## 2020-01-26 ENCOUNTER — Other Ambulatory Visit: Payer: Self-pay | Admitting: Neurology

## 2020-02-06 ENCOUNTER — Other Ambulatory Visit: Payer: Self-pay | Admitting: Neurology

## 2020-02-06 MED ORDER — ROPINIROLE HCL ER 12 MG PO TB24: 12.0000 mg | ORAL_TABLET | Freq: Every day | ORAL | 6 refills | Status: DC

## 2020-03-08 ENCOUNTER — Other Ambulatory Visit: Payer: Self-pay | Admitting: Internal Medicine

## 2020-03-08 DIAGNOSIS — G4700 Insomnia, unspecified: Secondary | ICD-10-CM

## 2020-05-23 ENCOUNTER — Other Ambulatory Visit: Payer: Self-pay | Admitting: Pediatrics

## 2020-05-23 DIAGNOSIS — E7522 Gaucher disease: Secondary | ICD-10-CM

## 2020-05-31 ENCOUNTER — Other Ambulatory Visit: Payer: Self-pay

## 2020-05-31 ENCOUNTER — Encounter: Payer: Self-pay | Admitting: Neurology

## 2020-05-31 ENCOUNTER — Ambulatory Visit: Payer: 59 | Admitting: Neurology

## 2020-05-31 VITALS — BP 100/64 | HR 75 | Ht 69.5 in | Wt 129.0 lb

## 2020-05-31 DIAGNOSIS — G4752 REM sleep behavior disorder: Secondary | ICD-10-CM

## 2020-05-31 DIAGNOSIS — G4700 Insomnia, unspecified: Secondary | ICD-10-CM

## 2020-05-31 DIAGNOSIS — G20 Parkinson's disease: Secondary | ICD-10-CM

## 2020-05-31 MED ORDER — ALPRAZOLAM 0.5 MG PO TABS: 0.5000 mg | ORAL_TABLET | Freq: Three times a day (TID) | ORAL | 5 refills | Status: DC | PRN

## 2020-05-31 MED ORDER — CARBIDOPA-LEVODOPA ER 25-100 MG PO TBCR: 1.0000 | EXTENDED_RELEASE_TABLET | Freq: Three times a day (TID) | ORAL | 3 refills | Status: AC | PRN

## 2020-05-31 NOTE — Progress Notes (Signed)
[facility]    Provider:  Dr Jaynee Eagles  CC:  Parkinson's disease:  Interval history 05/31/2020: Patient is here for follow-up of Parkinson's disease.  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  We also discussed Sinemet, she is on the extended release once a day, this was prescribed by [facility] Dr. Deboraha Sprang, we discussed IR versus extended release, at this time I will keep her on the extended release because that is what Dr. Deboraha Sprang prescribed to her, we discussed the difference between wearing off or ineffectiveness, at this time she feels the medication is working but wears off, she feels shaky inside, we discussed strategies whether to increase the medication as far as frequency or dosage, at this time we will add another dose around 1 PM and she could add another dose in the evening if needed, with her husband today, answered all questions.  Exam is stable.  Interval history November 15, 2019: Patient here for follow-up of Parkinson's disease.  Patient was seen by the movement disorder team at [facility] in May of this year and I reviewed those notes: She reports symptoms began in 2018 with right lower extremity tremor, right-sided weakness and difficulty with typing and handwriting, she went extensive work-up in 2019 to early 2020 by her PCP, neurosurgery, rheumatology and neurology ultimately diagnosed with Parkinson's disease after a DaTscan was positive, pramipexole made her too tired, Neupro patch helped but she developed a rash and was switched to ropinirole which she was currently taking at this appointment.  She denied rigidity, falls, balance issues, dizziness or gait freezing, speech changes, sialorrhea, choking or swallowing, hallucinations, mood or cognitive concerns or impulsive behaviors.  U PDR S score was 21, Examination showed minimal symmetric  bradykinesia, no rigidity and resting right lower extremity tremor consistent with diagnosis of Parkinson's disease supported by her abnormal DaTscan's and response to ropinirole, her ropinirole was increased from 6 mg to 8 mg, gabapentin was continued, melatonin was recommended and mirtazapine was suggested to be decreased but restart if she does notice worsening of anxiety or sleep problems, DBS not recommended at that time but patient be an excellent candidate in the future.  They also discussed genetic testing for Parkinson's disease, daily exercise.  She will follow-up with Dr. Deboraha Sprang annually.  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 We discussed follow up with hematology and I sent a message to Dr. Jana Hakim regarding patient.    Interval history 05/18/2019: She moves a little slower. She tremors when she is overtired. We discussed [facility] movement disorders team, we also discussed Education officer, museum at [employer] [facility], they do not feel the social worker will help, Patient would like to be referred to a movement disorder specialist clinic and when appropriate they would like DBS, will refer to [facility] Dr. Donna Christen team. [name] but despite the thngs we tried, we could not resolve her rash on the pastch. We have changed to oral DA and she feels better, she has some swelling and also feels she is a little bloated but tolerating well, she had a colonoscopy and she is fine, discussed association with skin cancers she needs to get a check up regularly. Discussed with husband. She has no REM sleep disorder. She feels stable, sleeping better. Can give her limited  xanax.   Interval history 11/15/2018: She has tingling n the back area, scratching helps it. Since she saw me [name], who is a movement disorder specialist. The specialist recommend mirtazapine and  Gabapentin. Sleeping is not better. She is on 35m Neuro patch and doing well. Start with the Gabapentin, decrease ambien at night. Also in a few weeks depending on how doing may switch mirtazapine or add it to the regimen but beware of sedation, discussed at length but decrease ambien and woul dlike to get her off of the [place] with studies associating   Patient complains of symptoms per HPI as well as the following symptoms: Feels tremors. Pertinent negatives and positives per HPI. All others negative   Interval history Aug 25, 2018: I connected with this lovely patient and her husband today to follow-up on her Parkinson's disease.  She was started on oral dopamine agonists and had a sleep attack she was switched to the Neupro patch which is working quite well for her.  She feels as though she could increase it.  On the 2 mg, felt better. She still has insomnia but that is not new. She feels pressure on her bladder at night, she took peridium and it helped. I recommended she have her urine tested, she went to her pcp and he tested her urine, she had these symptoms prior to starting the medication and has been working with her pcp. She bought a bike for exercise. Sheis exercising daily for 30 minutes. She is also going to start boxing. She feels she needs an increase to her neupro, will increase to 322m She has tingling in the leg with radicular appointments. Asked her to fu with stern, I don;t have access to the mr lumbar spine. Will repeat CT of the head to look at the focus in the brain in 6 months. Discussed PFO, follow with primary care.  Discussed her use of Ambien, to reports have shown association with Parkinson's disease, at this point will change to clonazepam which is also used for REM sleep disorder.  Addendum 06/03/2018: DAT Scan: IMPRESSION:Loss of dopamine transport activity in the bilateral striata is a pattern typical of Parkinson's syndrome pathology  Addendum 05/20/2018: Patient  with reported low back pain with radiculopathy of right leg, right arm and leg weakness and sensory changes, diffuse pain. Reviewed workup with patient today. EMG/NCS within normal limits. She still has episodes of leg tremoring but she is feeling better and if she tells herself to stop she can stop it. She also still feels symptomatic in her right arm and right leg. But symptoms happen separately so may not be related. She went to physical therapy and she loves it. She is going to start Pilates. Reviewed MRI of the brain and thoracic imaging and labwork to date with patient which is extensive (see below). [facility]. Her worst symptoms are the tingling and numbness in the prox arm and legs and the tremor. She asks me for a refill of Ambien, I will give her one refill of ambien but I do not agree with long-term use so she will have to see her primary care to discuss. Needs a referral to a sleep counselor.  Patient needs cognitive behavioral therapy for insomnia. Please refer to [facility]  MRI brain: This MRI of the brain with and without contrast shows the following:  1.   Brain parenchyma appears normal before and after contrast. 2.  7 to 8 mm focus in the right skull.  Surrounding skull appears normal.   This appears to be benign. She should repeat CT head in 6 months to a year to follow right skull focus, explained she should review with pcp and ask her to order as she may not need follow up in neurology. At this time she is looking for a new pcp and understands it is her responsibility to follow up on this. MRI Thoracic Spine: This MRI of the thoracic spine with and without contrast shows the following: 1.   The spinal cord appears normal. 2.   Small disc protrusions at T7-T8, T8-T9 and T10-T11 that do not cause nerve root compression or spinal stenosis. 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 Showed a small  pfo. Emailed Dr. Rayann Heman, no intervention needed.  Parkinsonism?: Pending DAT scan  Normal labs: Vit D, MMA, Lyme, tsh, HIV, esr, sjogrens, pan-anca, b12, folate, rpr, help c, paraneoplatic abs, celiac antibodies, heavy metals, b6, IFE/SPEP, copper, b1, cmo, cbc, ana   HPI:  Caitlin Braun is a 65 y.o. female here as requested by Dr. Erline Levine for low back pain.  She has been seen by [place] [facility] and EMG nerve conduction study of the right upper extremity did not show any etiology for her symptoms. [facility]-spine and MRI L-spine and emg/ncs of the right arm. She has seen rheumatology. Here with her husband who provides much information. About 2 years she started feeling tired and lethargic, she was feeling joint pain diffuse. Then she started having right thumb pain and saw rheumatologist who allayed her fears about rheumatologic disease, diagnosed with hyper joints. She has had neck issues under her should blade on her right side and tingles and radiates down the right arm to the thumb. Massage and heat helps. She has some tremor in the right arm. Also when she sits on the toilet her right leg tremors. Also her handwriting is affected. She has weakness in the right hand, her hand goes numb, she can hardly write. She has weakness in the right hand. No problems with smell or taste, but he appetite has decreased and taste has changed per husband. No drooling or wet pillows at night. She has imbalance. No REM sleep disorder. No falls. She denies incontinence, She mumbles a lot this is not new but husband has hearing difficulties, no hypophonia. Husband has noticed some shuffling. No changes in facial expressions. She has back pain and radiation into the right leg. Numbness in the right leg. No other focal neurologic deficits, associated symptoms, inciting events or modifiable factors.  Reviewed notes, labs and imaging from outside physicians,  which showed:  Reviewed notes from Dr. Vertell Limber in his office neurosurgery and spine.:  Patient has been experiencing right upper extremity neck pain and also numbness and tingling in digits 1 and 2.  Diminished dexterity in the right hand.  Past medical history is otherwise noncontributory.  She has right shoulder right arm and right some numbness and tingling.  Numbness is exacerbated by use.  She denies any neck pain.  She also notes some right leg numbness and tingling that begins in her right head and extends into her right outer toes.  She is noticed balance issues and handwriting issues over the last several months.  She also reports tremors in her right hand.  Her rheumatologist diagnosed hyperflexible right thumb and elevate her concern for autoimmune process.  On  exam her neurologic exam is significant for decreased sensory in the right C6 distribution, Spurling's positive on the right, Tinel's positive on the right, Phalen's positive on the right.  She does have 4 out of 5 right hand intrinsics weakness and decreased pin sensitivity in the first and second digits on the right otherwise negative straight leg raise negative right sciatic notch discomfort.  Reviewed MRI of the cervical spine report I do not have imaging.  Impression is L4-L5 bilateral facet Orth osteoarthritis allowing 2 mm of anterolisthesis.  Mild bulging of the disc.  No compressive stenosis, the back arthritis would be a cause of back pain or referred pain.  Chronic appearing degenerative changes of the discs at L3-L4 and L5-S1 but without herniation.  No stenosis or neural compression.  Exam date January 27, 2018.  MRI of the cervical spine reviewed report I do not have the images available: No significant degenerative changes, no stenosis or neural compression, no abnormality seen to explain neck pain and right arm symptoms.  T2 bright focus within the anterior right T1 vertebral body which may be a benign cyst would recommend,  suggest a contrast-enhanced examination to rule out enhancing marrow space lesion.  Labs include normal thyroid panel, BUN 16 and creatinine 0.58 collected 03/25/2017, ANA negative, rheumatoid arthritis negative, sed rate negative, CEA normal, CA normal CBC was unremarkable April 11, 2017 except for mildly low platelets at 136.  Reviewed data and results of electrodiagnostic testing summary includes right upper extremity was within normal limits with no evidence for cervical radiculopathy, brachial plexopathy, peripheral median or ulnar neuropathy as well as polyneuropathy.  Per the note she could be experiencing some cervical radiculitis is not manifesting on electrodiagnostic testing, recommended MRI cervical spine.  Also recommended possibly repeating in the future.  Due to right arm numbness right arm weakness and right arm pain.     Review of Systems: Patient complains of symptoms per HPI as well as the following symptoms: numbness, weakness, insomnia, leg pain, tremor, aching muscles . Pertinent negatives and positives per HPI. All others negative.   Social History   Socioeconomic History  . Marital status: Married    Spouse name: Not on file  . Number of children: 3  . Years of education: Not on file  . Highest education level: Bachelor's degree (e.g., BA, AB, BS)  Occupational History  . Not on file  Tobacco Use  . Smoking status: Never Smoker  . Smokeless tobacco: Never Used  Vaping Use  . Vaping Use: Some days  . Substances: THC  Substance and Sexual Activity  . Alcohol use: Yes    Alcohol/week: 3.0 standard drinks    Types: 3 Standard drinks or equivalent per week    Comment: occasional  . Drug use: Yes    Frequency: 3.0 times per week    Types: Marijuana  . Sexual activity: Not on file  Other Topics Concern  . Not on file  Social History Narrative   Lives at home with husband   Right handed   Caffeine: 1/2 cup "maybe" daily   Social Determinants of Health    Financial Resource Strain: Not on file  Food Insecurity: Not on file  Transportation Needs: Not on file  Physical Activity: Not on file  Stress: Not on file  Social Connections: Not on file  Intimate Partner Violence: Not on file    Family History  Problem Relation Age of Onset  . Cancer Mother   . Cancer Father   .  Cancer Brother   . Other Daughter        carrier of Gauchers.  . Breast cancer Neg Hx   . Parkinson's disease Neg Hx     Past Medical History:  Diagnosis Date  . GAD (generalized anxiety disorder)   . Gauchers disease ([place])   . Osteoporosis   . Parkinson disease [facility])     Past Surgical History:  Procedure Laterality Date  . ABDOMINAL HYSTERECTOMY    . AUGMENTATION MAMMAPLASTY Bilateral 2016, 1998   Patient had them redone in 2016  . COSMETIC SURGERY    . face lift    . NOSE SURGERY     cosmetic    Current Outpatient Medications  Medication Sig Dispense Refill  . Biotin w/ Vitamins C & E (HAIR/SKIN/NAILS PO) Take by mouth.    . Calcium Carb-Cholecalciferol (CALCIUM + D3 PO) Take 500 mg by mouth daily.    . Cholecalciferol (VITAMIN D3 PO) Take 2,000 Int'l Units by mouth daily.    . Coenzyme Q10 (CO Q-10) 100 MG CAPS Take by mouth.    . denosumab (PROLIA) 60 MG/ML SOSY injection Inject 1 mL into the skin every 6 (six) months.    . Fexofenadine-Pseudoephedrine (ALLEGRA-D 12 HOUR PO) Take 1 tablet by mouth daily.     Marland Kitchen FLUTICASONE PROPIONATE NA Place 1 spray into both nostrils at bedtime.    . gabapentin (NEURONTIN) 300 MG capsule Take 1 capsule (300 mg total) by mouth 3 (three) times daily as needed. 270 capsule 4  . ketotifen (ZADITOR) 0.025 % ophthalmic solution Place 1 drop into both eyes as needed.    Marland Kitchen KRILL OIL PO Take 500 mg by mouth daily.    Marland Kitchen MAGNESIUM PO Take by mouth daily.     . Minoxidil (ROGAINE EX) Apply 2 drops topically daily.    . mirtazapine (REMERON) 7.5 MG tablet TAKE 1 TABLET (7.5 MG TOTAL) BY MOUTH AT BEDTIME. 90 tablet 3  .  montelukast (SINGULAIR) 10 MG tablet Take 10 mg by mouth at bedtime.    . Multiple Vitamin (MULTIVITAMIN) tablet Take 1 tablet by mouth daily.    . Potassium Gluconate 550 MG TABS Take 1 tablet by mouth daily.    Marland Kitchen rOPINIRole 12 MG TB24 Take 1 tablet (12 mg total) by mouth daily. 30 tablet 6  . Wheat Dextrin (BENEFIBER PO) Take by mouth.    . ALPRAZolam (XANAX) 0.5 MG tablet Take 1 tablet (0.5 mg total) by mouth 3 (three) times daily as needed. 90 tablet 5  . Carbidopa-Levodopa ER (SINEMET CR) 25-100 MG tablet controlled release Take 1 tablet by mouth 3 (three) times daily as needed. 270 tablet 3   No current facility-administered medications for this visit.    Allergies as of 05/31/2020 - Review Complete 05/31/2020  Allergen Reaction Noted  . Augmentin [amoxicillin-pot clavulanate] Hives 03/02/2018  . Perflutren lipid microsphere Other (See Comments) 03/24/2018  . Neupro [rotigotine] Rash 05/18/2019    Vitals: BP 100/64 (BP Location: Right Arm, Patient Position: Sitting)   Pulse 75   Ht 5' 9.5" (1.765 m)   Wt 129 lb (58.5 kg)   BMI 18.78 kg/m  Last Weight:  Wt Readings from Last 1 Encounters:  05/31/20 129 lb (58.5 kg)   Last Height:   Ht Readings from Last 1 Encounters:  05/31/20 5' 9.5" (1.765 m)   Physical exam: Stable Exam: Gen: NAD, conversant, well nourised, thin,well groomed  CV: RRR, +SEM. No Carotid Bruits. No peripheral edema, warm, nontender Eyes: Conjunctivae clear without exudates or hemorrhage MSK: tall, thin, thin and long fingers, joint laxity  Neuro: Stable Detailed Neurologic Exam  Speech:    Speech is normal; fluent and spontaneous with normal comprehension.  Cognition:    The patient is oriented to person, place, and time;     recent and remote memory intact;     language fluent;     normal attention, concentration,     fund of knowledge Cranial Nerves: Hypomimia    The pupils are equal, round, and reactive to light. Visual  fields are full to finger confrontation. Extraocular movements are intact. Trigeminal sensation is intact and the muscles of mastication are normal. Bilateral ptosis. The palate elevates in the midline. Hearing intact. Voice is normal. Shoulder shrug is normal. The tongue has normal motion without fasciculations.    Gait:    Decreased right arm swing.   Motor Observation:    Postural tremor, no resting tremor Tone:    Normal muscle tone.   Posture:    Posture is normal. normal erect    Strength: Mild generalized weakness more proximaly 4+/5 in the deltoids and hip flexors, otherwise strength is V/V in the upper and lower limbs.      Sensation: intact to LT     Reflex Exam:  DTR's: right biceps slightly increased as compared to the left.  Brisk lowers for age.     Toes:    The toes are downgoing bilaterally.   Clonus:    2 beats clonus at AJs        Assessment/Plan:  65 year old female with multiple symptoms including chronic neck and back pain, radicular symptoms, fatigue, lethargy, diffuse pain, hyer joints, neck pain muscular, tremors, worsening weakness in right arm and hand and numbness, imbalance, hypomimia, decreased arm swing, numbnes sin right leg. She has already been extensively evaluated without causes found.  Extensive evaluation including MRI of the brain, MRI of the cervical/thoracic/lumbar spine, emg/ncs, lab testing, genetic testing, DaTscan revealed Parkinson's disease.  - Patient was seen at [facility] and genetic testing and discovered she has Gaucher's disease. 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  She is ongoing with genetics at [facility] and follows with Dr. Deboraha Sprang and movement disorders at [place] current medications and we will add an extra pill of the carbidopa levodopa, Dr. Deboraha Sprang had started her on extended release at this time we will keep her on the extended release but may  consider changing to IR, will see with Dr. Deboraha Sprang does at next appointment. - We had a long discussion about the above, doing well on current medication will continue, will see what hematology and genetic counseling recommend.  - Discussed Gaucher disease, rare genetic disorder, associated conditions. Will refer to the proper specialists, she appears to be asymptomatic but there is treatment in the form of an enzyme supplementation. - Hepatomegaly, maximum coronal span 23 cm from 11/03/2018, she is following with the [facility], hepatomegaly can be seen in Gaucher's disease. - Doing well on Requip, likes Neupro but skin rash made it intolerable, she was started on carbidopa levodopa extended release by Dr. Deboraha Sprang awake, but feels it is wearing off, will add extra doses during the day.  We did discuss Rytary.  - Refill meds  - Discussed skin checks  - CT scan of the head with/w ocntrast to follow up  on the skull lesion seen on MRI head. Stable.   - exercise, continue  - Extensive Parkinson's education provided, answered all questions today,  - 6  months in the office for follow-up here, she sees Dr. Deboraha Sprang again in December.  Meds ordered this encounter  Medications  . Carbidopa-Levodopa ER (SINEMET CR) 25-100 MG tablet controlled release    Sig: Take 1 tablet by mouth 3 (three) times daily as needed.    Dispense:  270 tablet    Refill:  3  . ALPRAZolam (XANAX) 0.5 MG tablet    Sig: Take 1 tablet (0.5 mg total) by mouth 3 (three) times daily as needed.    Dispense:  90 tablet    Refill:  5    This request is for a new prescription for a controlled substance as required by Federal/State law. DX Code Needed  .      PRIOR:   - Discussed her use of Ambien, to reports have shown association with Parkinson's disease, at this point will change to gabapentin/mirtazapine may help with anxiety too.  - Lumbar radiculopathy: feels better with exercise, f/u with Dr. Maryjean Ka if needed, she canceled  her last shot bc she was feeling better.  Patient has lumbar radiculopathy, has seen Dr. Vertell Limber in the past for this, requesting epidural steroid injections or evaluation and treatment as clinically warranted with pain procedures.  Per Dr. Jana Hakim: "Checking the Gaucher org site, there is one center in [place] [phone]) and one at the [facility] in [place] (([phone])--perhaps your patient could start there; we then might be able to play a supportive role   Hope this helps! "  No orders of the defined types were placed in this encounter.    Addendum 06/03/2018: DAT Scan: IMPRESSION:Loss of dopamine transport activity in the bilateral striata is a pattern typical of Parkinson's syndrome pathology  Addendum 05/20/2018: Patient with reported low back pain with radiculopathy of right leg, right arm and leg weakness and sensory changes, diffuse pain. Reviewed workup with patient today. EMG/NCS within normal limits. She still has episodes of leg tremoring but she is feeling better and if she tells herself to stop she can stop it. She also still feels symptomatic in her right arm and right leg. But symptoms happen separately so may not be related. She went to physical therapy and she loves it. She is going to start Pilates. Reviewed MRI of the brain and thoracic imaging and labwork to date with patient which is extensive (see below). She has already has MRI cervical spine and lumbar spine at [facility]. Her worst symptoms are the tingling and numbness in the prox arm and legs and the tremor. She asks me for a refill of Ambien, I will give her one refill of ambien but I do not agree with long-term use so she will have to see her primary care to discuss. Needs a referral to a sleep counselor.  - Patient did well on Neupro but could not tolerate had rashes, now on oral dopamine agonists. - Patient was seen at [facility] and genetic testing and discovered she has Gaucher's disease.  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 Will refer to hematology and genetic counseling.  - We had a long discussion about the above, doing well on current medication will continue   MRI brain: This MRI of the brain with and without contrast shows the following:  1.   Brain  parenchyma appears normal before and after contrast. 2.   7 to 8 mm focus in the right skull.  Surrounding skull appears normal.   This appears to be benign. She should repeat CT head in 6 months to a year to follow right skull focus, explained she should review with pcp and ask her to order as she may not need follow up in neurology. At this time she is looking for a new pcp and understands it is her responsibility to follow up on this. MRI Thoracic Spine: This MRI of the thoracic spine with and without contrast shows the following: 1.   The spinal cord appears normal. 2.   Small disc protrusions at T7-T8, T8-T9 and T10-T11 that do not cause nerve root compression or spinal stenosis. 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 Showed a small pfo. Emailed Dr. Rayann Heman, no intervention needed.  Normal labs: Vit D, MMA, Lyme, tsh, HIV, esr, sjogrens, pan-anca, b12, folate, rpr, help c, paraneoplatic abs, celiac antibodies, heavy metals, b6, IFE/SPEP, copper, b1, cmo, cbc, ana  Initial Assessment and plan 03/02/2018:  - Need to evaluate for MS given diffuse symptoms. MRI brain and cervical spine w/wo contrast - MRI of the brain to evaluate for MS, stroke, masses or other lesions causing her focal neurologic symptoms. Also MRI cervical and thoracic spine w/wo contrast to evaluate for  T2 bright focus within the anterior right T1 vertebral body which may be a benign cyst but suggest a contrast-enhanced examination to rule out enhancing marrow space lesion or other lesions. - Right rhomboid and trap tightness, heat and massage feels good. Dry needling for cervical  myofascial pain - EMG/NCS of the right arm and right leg - Phenotypically she appears Marfan-like?: echocardiogram and f/u with pcp to discuss referral if clinically warranted and possibly genetic testing - Consider DAT scan, parkinsonism on exam - Tremor: may be essential tremor? MRI brain and cervical spine - Extensive lab testing today - Physical therapy for right-sided and pain and gait abnormality and proximal weakness: Mild generalized weakness more proximaly 4+/5 in the deltoids and hip flexors - A wrist splint to support right wrist and right thumb. - Xray of right hand  No orders of the defined types were placed in this encounter.   Cc:  Dr. Deniece Ree   I spent over 40 minutes of face-to-face and non-face-to-face time with patient on the  1. Parkinson's disease ([place])   2. Insomnia, unspecified type   3. REM sleep behavior disorder    diagnosis.  This included previsit chart review, lab review, study review, order entry, electronic health record documentation, patient education on the different diagnostic and therapeutic options, counseling and coordination of care, risks and benefits of management, compliance, or risk factor reduction  Sarina Ill, MD  [facility] [address] [place], [place] [address]  Phone [phone] Fax [phone]

## 2020-06-22 ENCOUNTER — Other Ambulatory Visit: Payer: Self-pay

## 2020-06-25 ENCOUNTER — Other Ambulatory Visit: Payer: Self-pay

## 2020-06-25 ENCOUNTER — Ambulatory Visit (INDEPENDENT_AMBULATORY_CARE_PROVIDER_SITE_OTHER): Payer: 59

## 2020-06-25 DIAGNOSIS — M810 Age-related osteoporosis without current pathological fracture: Secondary | ICD-10-CM | POA: Diagnosis not present

## 2020-06-25 MED ORDER — DENOSUMAB 60 MG/ML ~~LOC~~ SOSY
60.0000 mg | PREFILLED_SYRINGE | Freq: Once | SUBCUTANEOUS | Status: AC
  Administered 2020-06-25: 60 mg via SUBCUTANEOUS

## 2020-06-25 NOTE — Progress Notes (Signed)
Pt came in for her prolia injection. Injection given in the left arm, pt tolerated injection well.

## 2020-06-26 ENCOUNTER — Other Ambulatory Visit: Payer: Self-pay | Admitting: Neurology

## 2020-07-05 ENCOUNTER — Other Ambulatory Visit: Payer: Self-pay | Admitting: Neurology

## 2020-07-11 ENCOUNTER — Telehealth: Payer: Self-pay | Admitting: *Deleted

## 2020-07-11 ENCOUNTER — Other Ambulatory Visit: Payer: Self-pay | Admitting: Neurology

## 2020-07-11 MED ORDER — GABAPENTIN 300 MG PO CAPS: 300.0000 mg | ORAL_CAPSULE | Freq: Three times a day (TID) | ORAL | 3 refills | Status: DC

## 2020-07-11 NOTE — Telephone Encounter (Signed)
We received a refill request of gabapentin that states patient reported dose is increased to 1 capsule QID.  Previous prescription says to take TID.

## 2020-07-11 NOTE — Telephone Encounter (Signed)
Message sent to Dr Ahern

## 2020-07-11 NOTE — Telephone Encounter (Signed)
That's fine, I placed the prescription order

## 2020-08-20 ENCOUNTER — Other Ambulatory Visit: Payer: Self-pay

## 2020-08-21 ENCOUNTER — Encounter: Payer: Self-pay | Admitting: Internal Medicine

## 2020-08-21 ENCOUNTER — Ambulatory Visit: Payer: 59 | Admitting: Internal Medicine

## 2020-08-21 VITALS — BP 92/64 | HR 71 | Temp 97.7°F | Ht 69.5 in | Wt 132.9 lb

## 2020-08-21 DIAGNOSIS — Z79899 Other long term (current) drug therapy: Secondary | ICD-10-CM

## 2020-08-21 NOTE — Progress Notes (Signed)
Established Patient Office Visit     This visit occurred during the SARS-CoV-2 public health emergency.  Safety protocols were in place, including screening questions prior to the visit, additional usage of staff PPE, and extensive cleaning of exam room while observing appropriate contact time as indicated for disinfecting solutions.    CC/Reason for Visit: Needs an EKG  HPI: Caitlin Braun is a 65 y.o. female who is coming in today for the above mentioned reasons. 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  She is to be started on Cerdelga and was told that she needed an EKG to assess for baseline QT prolongation.  She is doing well and has no acute complaints.  She received her fourth COVID-vaccine and will be due for her physical in June.   Past Medical/Surgical History: Past Medical History:  Diagnosis Date  . GAD (generalized anxiety disorder)   . Gauchers disease ([place])   . Osteoporosis   . Parkinson disease [facility])     Past Surgical History:  Procedure Laterality Date  . ABDOMINAL HYSTERECTOMY    . AUGMENTATION MAMMAPLASTY Bilateral 2016, 1998   Patient had them redone in 2016  . COSMETIC SURGERY    . face lift    . NOSE SURGERY     cosmetic    Social History:  reports that she has never smoked. She has never used smokeless tobacco. She reports current alcohol use of about 3.0 standard drinks of alcohol per week. She reports current drug use. Frequency: 3.00 times per week. Drug: Marijuana.  Allergies: Allergies  Allergen Reactions  . Augmentin [Amoxicillin-Pot Clavulanate] Hives  . Perflutren Lipid Microsphere Other (See Comments)    Back pain  . Neupro [Rotigotine] Rash    Allergic to the adhesive     Family History:  Family History  Problem Relation Age of Onset  . Cancer Mother   . Cancer Father   . Cancer Brother   . Other Daughter        carrier of Gauchers.  .  Breast cancer Neg Hx   . Parkinson's disease Neg Hx      Current Outpatient Medications:  .  ALPRAZolam (XANAX) 0.5 MG tablet, Take 1 tablet (0.5 mg total) by mouth 3 (three) times daily as needed., Disp: 90 tablet, Rfl: 5 .  Biotin w/ Vitamins C & E (HAIR/SKIN/NAILS PO), Take by mouth., Disp: , Rfl:  .  Calcium Carb-Cholecalciferol (CALCIUM + D3 PO), Take 500 mg by mouth daily., Disp: , Rfl:  .  Carbidopa-Levodopa ER (SINEMET CR) 25-100 MG tablet controlled release, Take 1 tablet by mouth 3 (three) times daily as needed., Disp: 270 tablet, Rfl: 3 .  Cholecalciferol (VITAMIN D3 PO), Take 2,000 Int'l Units by mouth daily., Disp: , Rfl:  .  Coenzyme Q10 (CO Q-10) 100 MG CAPS, Take by mouth., Disp: , Rfl:  .  denosumab (PROLIA) 60 MG/ML SOSY injection, Inject 1 mL into the skin every 6 (six) months., Disp: , Rfl:  .  Fexofenadine-Pseudoephedrine (ALLEGRA-D 12 HOUR PO), Take 1 tablet by mouth daily. , Disp: , Rfl:  .  FLUTICASONE PROPIONATE NA, Place 1 spray into both nostrils at bedtime., Disp: , Rfl:  .  gabapentin (NEURONTIN) 300 MG capsule, Take 1 capsule (300 mg total) by mouth 4 (four) times daily as needed., Disp: 360 capsule, Rfl: 1 .  gabapentin (NEURONTIN) 300 MG capsule, Take 1 capsule (300 mg total) by mouth  3 (three) times daily., Disp: 360 capsule, Rfl: 3 .  ketotifen (ZADITOR) 0.025 % ophthalmic solution, Place 1 drop into both eyes as needed., Disp: , Rfl:  .  KRILL OIL PO, Take 500 mg by mouth daily., Disp: , Rfl:  .  MAGNESIUM PO, Take by mouth daily. , Disp: , Rfl:  .  Minoxidil (ROGAINE EX), Apply 2 drops topically daily., Disp: , Rfl:  .  mirtazapine (REMERON) 7.5 MG tablet, TAKE 1 TABLET (7.5 MG TOTAL) BY MOUTH AT BEDTIME., Disp: 90 tablet, Rfl: 3 .  montelukast (SINGULAIR) 10 MG tablet, Take 10 mg by mouth at bedtime., Disp: , Rfl:  .  Multiple Vitamin (MULTIVITAMIN) tablet, Take 1 tablet by mouth daily., Disp: , Rfl:  .  Potassium Gluconate 550 MG TABS, Take 1 tablet by  mouth daily., Disp: , Rfl:  .  Ropinirole HCl 12 MG TB24, TAKE 1 TABLET (12 MG TOTAL) BY MOUTH DAILY., Disp: 90 tablet, Rfl: 1 .  Wheat Dextrin (BENEFIBER PO), Take by mouth., Disp: , Rfl:   Review of Systems:  Constitutional: Denies fever, chills, diaphoresis, appetite change and fatigue.  HEENT: Denies photophobia, eye pain, redness, hearing loss, ear pain, congestion, sore throat, rhinorrhea, sneezing, mouth sores, trouble swallowing, neck pain, neck stiffness and tinnitus.   Respiratory: Denies SOB, DOE, cough, chest tightness,  and wheezing.   Cardiovascular: Denies chest pain, palpitations and leg swelling.  Gastrointestinal: Denies nausea, vomiting, abdominal pain, diarrhea, constipation, blood in stool and abdominal distention.  Genitourinary: Denies dysuria, urgency, frequency, hematuria, flank pain and difficulty urinating.  Endocrine: Denies: hot or cold intolerance, sweats, changes in hair or nails, polyuria, polydipsia. Musculoskeletal: Denies myalgias, back pain, joint swelling, arthralgias and gait problem.  Skin: Denies pallor, rash and wound.  Neurological: Denies dizziness, seizures, syncope, weakness, light-headedness, numbness and headaches.  Hematological: Denies adenopathy. Easy bruising, personal or family bleeding history  Psychiatric/Behavioral: Denies suicidal ideation, mood changes, confusion, nervousness, sleep disturbance and agitation    Physical Exam: Vitals:   08/21/20 0823  BP: 92/64  Pulse: 71  Temp: 97.7 F (36.5 C)  TempSrc: Oral  SpO2: 99%  Weight: 132 lb 14.4 oz (60.3 kg)  Height: 5' 9.5" (1.765 m)    Body mass index is 19.34 kg/m.   Constitutional: NAD, calm, comfortable Eyes: PERRL, lids and conjunctivae normal ENMT: Mucous membranes are moist.  Respiratory: clear to auscultation bilaterally, no wheezing, no crackles. Normal respiratory effort. No accessory muscle use.  Cardiovascular: Regular rate and rhythm, no murmurs / rubs /  gallops. No extremity edema.  Neurologic: Grossly intact and nonfocal Psychiatric: Normal judgment and insight. Alert and oriented x 3. Normal mood.    Impression and Plan:  Medication management   -EKG done in office today at the advice of [facility] and pediatrics.  There is no baseline QT prolongation on EKG that has been done and interpreted by myself in office today.  In fact EKG is normal.  She will be referred back to [facility].  She will be due for physical in June.     Lelon Frohlich, MD [facility]

## 2020-08-23 ENCOUNTER — Telehealth: Payer: Self-pay | Admitting: Internal Medicine

## 2020-08-23 ENCOUNTER — Telehealth: Payer: Self-pay | Admitting: *Deleted

## 2020-08-23 NOTE — Telephone Encounter (Signed)
Caitlin Braun is calling from Dr. Lonell Grandchild is calling and wanted to see if provider can fax over patient's recent EKG to [phone]. CB is [phone]

## 2020-08-23 NOTE — Telephone Encounter (Signed)
ekg faxed and confirmed.

## 2020-08-23 NOTE — Telephone Encounter (Signed)
Entered in error

## 2020-08-29 NOTE — Telephone Encounter (Signed)
Caitlin Braun is calling back from Dr. Ronalee Red and stated that they received EKG but there were no results and wanted to see if it can be faxed back to [phone]. CB is [phone]

## 2020-08-29 NOTE — Telephone Encounter (Signed)
ekg faxed and confirmed.

## 2020-09-10 NOTE — Telephone Encounter (Signed)
Spoke with patient and r/s her appt for Monday 6/27 @ 1:15 PM.

## 2020-09-26 ENCOUNTER — Other Ambulatory Visit: Payer: Self-pay | Admitting: Obstetrics and Gynecology

## 2020-09-26 DIAGNOSIS — Z1231 Encounter for screening mammogram for malignant neoplasm of breast: Secondary | ICD-10-CM

## 2020-09-29 ENCOUNTER — Encounter (HOSPITAL_BASED_OUTPATIENT_CLINIC_OR_DEPARTMENT_OTHER): Payer: Self-pay

## 2020-09-29 ENCOUNTER — Emergency Department (HOSPITAL_BASED_OUTPATIENT_CLINIC_OR_DEPARTMENT_OTHER): Payer: BC Managed Care – PPO

## 2020-09-29 ENCOUNTER — Other Ambulatory Visit: Payer: Self-pay

## 2020-09-29 ENCOUNTER — Inpatient Hospital Stay (HOSPITAL_BASED_OUTPATIENT_CLINIC_OR_DEPARTMENT_OTHER)
Admission: EM | Admit: 2020-09-29 | Discharge: 2020-10-01 | DRG: 153 | Disposition: A | Discharge status: Home / Self Care | Payer: BC Managed Care – PPO | Attending: Pulmonary Disease | Admitting: Pulmonary Disease

## 2020-09-29 DIAGNOSIS — Z881 Allergy status to other antibiotic agents status: Secondary | ICD-10-CM

## 2020-09-29 DIAGNOSIS — J0431 Supraglottitis, unspecified, with obstruction: Secondary | ICD-10-CM | POA: Diagnosis not present

## 2020-09-29 DIAGNOSIS — Z79899 Other long term (current) drug therapy: Secondary | ICD-10-CM

## 2020-09-29 DIAGNOSIS — J309 Allergic rhinitis, unspecified: Secondary | ICD-10-CM | POA: Diagnosis present

## 2020-09-29 DIAGNOSIS — Z90710 Acquired absence of both cervix and uterus: Secondary | ICD-10-CM

## 2020-09-29 DIAGNOSIS — F411 Generalized anxiety disorder: Secondary | ICD-10-CM | POA: Diagnosis present

## 2020-09-29 DIAGNOSIS — G20 Parkinson's disease: Secondary | ICD-10-CM | POA: Diagnosis present

## 2020-09-29 DIAGNOSIS — E7522 Gaucher disease: Secondary | ICD-10-CM | POA: Diagnosis present

## 2020-09-29 DIAGNOSIS — R1310 Dysphagia, unspecified: Secondary | ICD-10-CM | POA: Diagnosis present

## 2020-09-29 DIAGNOSIS — Z809 Family history of malignant neoplasm, unspecified: Secondary | ICD-10-CM

## 2020-09-29 DIAGNOSIS — J0430 Supraglottitis, unspecified, without obstruction: Secondary | ICD-10-CM | POA: Diagnosis present

## 2020-09-29 DIAGNOSIS — M810 Age-related osteoporosis without current pathological fracture: Secondary | ICD-10-CM | POA: Diagnosis present

## 2020-09-29 DIAGNOSIS — Z880 Allergy status to penicillin: Secondary | ICD-10-CM

## 2020-09-29 DIAGNOSIS — Z8616 Personal history of COVID-19: Secondary | ICD-10-CM

## 2020-09-29 DIAGNOSIS — Z888 Allergy status to other drugs, medicaments and biological substances status: Secondary | ICD-10-CM

## 2020-09-29 LAB — CBC
HCT: 36 % (ref 36.0–46.0)
Hemoglobin: 11.7 g/dL — ABNORMAL LOW (ref 12.0–15.0)
MCH: 29.3 pg (ref 26.0–34.0)
MCHC: 32.5 g/dL (ref 30.0–36.0)
MCV: 90 fL (ref 80.0–100.0)
Platelets: 142 10*3/uL — ABNORMAL LOW (ref 150–400)
RBC: 4 MIL/uL (ref 3.87–5.11)
RDW: 13.6 % (ref 11.5–15.5)
WBC: 11 10*3/uL — ABNORMAL HIGH (ref 4.0–10.5)
nRBC: 0 % (ref 0.0–0.2)

## 2020-09-29 LAB — BASIC METABOLIC PANEL
Anion gap: 10 (ref 5–15)
BUN: 17 mg/dL (ref 8–23)
CO2: 26 mmol/L (ref 22–32)
Calcium: 10.1 mg/dL (ref 8.9–10.3)
Chloride: 102 mmol/L (ref 98–111)
Creatinine, Ser: 0.56 mg/dL (ref 0.44–1.00)
GFR, Estimated: >60 mL/min (ref >60)
Glucose, Bld: 110 mg/dL — ABNORMAL HIGH (ref 70–99)
Potassium: 3.7 mmol/L (ref 3.5–5.1)
Sodium: 138 mmol/L (ref 135–145)

## 2020-09-29 MED ORDER — METHYLPREDNISOLONE SODIUM SUCC 125 MG IJ SOLR
125.0000 mg | Freq: Once | INTRAMUSCULAR | Status: AC
  Administered 2020-09-29: 125 mg via INTRAVENOUS
  Filled 2020-09-29: qty 2

## 2020-09-29 MED ORDER — FAMOTIDINE IN NACL 20-0.9 MG/50ML-% IV SOLN
20.0000 mg | Freq: Once | INTRAVENOUS | Status: AC
  Administered 2020-09-29: 20 mg via INTRAVENOUS
  Filled 2020-09-29: qty 50

## 2020-09-29 MED ORDER — DIPHENHYDRAMINE HCL 50 MG/ML IJ SOLN
12.5000 mg | Freq: Once | INTRAMUSCULAR | Status: AC
  Administered 2020-09-29: 12.5 mg via INTRAVENOUS
  Filled 2020-09-29: qty 1

## 2020-09-29 NOTE — ED Notes (Signed)
Pt transported to CT with RN and on monitor.

## 2020-09-29 NOTE — ED Triage Notes (Addendum)
Pt took eligustat 84 mg PO at 0700 this morning and has had increase of oral swelling since ingestion. Pt states that her throat is painful w/ difficulty swallowing. Pt denies itchiness or hives. According to the husband, the pill is not to be crushed or dissolved in mouth and patient thinks that is what happened this morning.   Pt has been taking medication x one month, BID, and has never had a reaction before.

## 2020-09-29 NOTE — ED Provider Notes (Signed)
[facility] EMERGENCY DEPT Provider Note   CSN: [id number] Arrival date & time: 09/29/20  2141     History Chief Complaint  Patient presents with   Oral Swelling   Allergic Reaction    Caitlin Braun is a 65 y.o. female.   Allergic Reaction Presenting symptoms: difficulty swallowing   Presenting symptoms: no rash   Patient presents with a possible swelling in her throat and mouth.  Took her eliglustat this morning and after that feels as if her throat is more sore and having difficulty swallowing.  States she has not been able eat today.  States it also feels if she is having some difficulty breathing.  No chest pain.  States it feels it is not up in her throat.  Has not had problems with this medicine before and has been on it for a month or 2 now.  Does have some previous allergies with rashes to other medicines.  No fevers or chills.  No trauma.  States she was worried because she was told that the medicine should not be broken up or allowed to dissolve.    Past Medical History:  Diagnosis Date   GAD (generalized anxiety disorder)    Gauchers disease ([place])    Osteoporosis    Parkinson disease ([place])     Patient Active Problem List   Diagnosis Date Noted   Supraglottitis without airway obstruction 09/30/2020   Gaucher disease ([place]) 11/16/2019   Parkinson's disease ([place]) 06/21/2018   Numbness and tingling of right arm and leg 03/02/2018   Muscle weakness (generalized) 03/02/2018   Tremors of nervous system 03/02/2018   Deformity of right thumb joint 03/02/2018   Chronic pain of right thumb 03/02/2018   Chronic neck pain 03/02/2018   Bilateral low back pain with sciatica 03/02/2018   Myofascial pain syndrome, cervical 03/02/2018    Past Surgical History:  Procedure Laterality Date   ABDOMINAL HYSTERECTOMY     AUGMENTATION MAMMAPLASTY Bilateral 2016, 1998   Patient had them redone in 2016   COSMETIC SURGERY     face lift     NOSE SURGERY      cosmetic     OB History   No obstetric history on file.     Family History  Problem Relation Age of Onset   Cancer Mother    Cancer Father    Cancer Brother    Other Daughter        carrier of Gauchers.   Breast cancer Neg Hx    Parkinson's disease Neg Hx     Social History   Tobacco Use   Smoking status: Never   Smokeless tobacco: Never  Vaping Use   Vaping Use: Some days   Substances: THC  Substance Use Topics   Alcohol use: Yes    Alcohol/week: 3.0 standard drinks    Types: 3 Standard drinks or equivalent per week    Comment: occasional   Drug use: Yes    Frequency: 3.0 times per week    Types: Marijuana    Home Medications Prior to Admission medications   Medication Sig Start Date End Date Taking? Authorizing Provider  ALPRAZolam Duanne Moron) 0.5 MG tablet Take 1 tablet (0.5 mg total) by mouth 3 (three) times daily as needed. 05/31/20   Melvenia Beam, MD  Biotin w/ Vitamins C & E (HAIR/SKIN/NAILS PO) Take by mouth.    [provider]  Calcium Carb-Cholecalciferol (CALCIUM + D3 PO) Take 500 mg by mouth daily.  [provider]  Carbidopa-Levodopa ER (SINEMET CR) 25-100 MG tablet controlled release Take 1 tablet by mouth 3 (three) times daily as needed. 05/31/20   Melvenia Beam, MD  Cholecalciferol (VITAMIN D3 PO) Take 2,000 Int'l Units by mouth daily.    [provider]  Coenzyme Q10 (CO Q-10) 100 MG CAPS Take by mouth.    [provider]  denosumab (PROLIA) 60 MG/ML SOSY injection Inject 1 mL into the skin every 6 (six) months.    [provider]  Fexofenadine-Pseudoephedrine (ALLEGRA-D 12 HOUR PO) Take 1 tablet by mouth daily.     [provider]  FLUTICASONE PROPIONATE NA Place 1 spray into both nostrils at bedtime.    [provider]  gabapentin (NEURONTIN) 300 MG capsule Take 1 capsule (300 mg total) by mouth 4 (four) times daily as needed. 07/12/20   Melvenia Beam, MD  gabapentin (NEURONTIN) 300  MG capsule Take 1 capsule (300 mg total) by mouth 3 (three) times daily. 07/11/20   Melvenia Beam, MD  ketotifen (ZADITOR) 0.025 % ophthalmic solution Place 1 drop into both eyes as needed.    [provider]  KRILL OIL PO Take 500 mg by mouth daily.    [provider]  MAGNESIUM PO Take by mouth daily.     [provider]  Minoxidil (ROGAINE EX) Apply 2 drops topically daily.    [provider]  mirtazapine (REMERON) 7.5 MG tablet TAKE 1 TABLET (7.5 MG TOTAL) BY MOUTH AT BEDTIME. 01/26/20   Melvenia Beam, MD  montelukast (SINGULAIR) 10 MG tablet Take 10 mg by mouth at bedtime.    [provider]  Multiple Vitamin (MULTIVITAMIN) tablet Take 1 tablet by mouth daily.    [provider]  Potassium Gluconate 550 MG TABS Take 1 tablet by mouth daily.    [provider]  Ropinirole HCl 12 MG TB24 TAKE 1 TABLET (12 MG TOTAL) BY MOUTH DAILY. 07/05/20   Melvenia Beam, MD  Wheat Dextrin (BENEFIBER PO) Take by mouth.    [provider]    Allergies    Augmentin [amoxicillin-pot clavulanate], Perflutren lipid microsphere, and Neupro [rotigotine]  Review of Systems   Review of Systems  Constitutional:  Negative for appetite change.  HENT:  Positive for facial swelling and trouble swallowing. Negative for drooling.   Respiratory:  Positive for shortness of breath.   Cardiovascular:  Negative for chest pain.  Gastrointestinal:  Negative for abdominal pain.  Genitourinary:  Negative for flank pain.  Musculoskeletal:  Negative for back pain.  Skin:  Negative for rash.  Neurological:  Negative for weakness.  Psychiatric/Behavioral:  Negative for confusion.    Physical Exam Updated Vital Signs BP 117/73   Pulse 79   Temp 98.6 F (37 C) (Oral)   Resp 15   Ht 5\' 9"  (1.753 m)   Wt 58.1 kg   SpO2 100%   BMI 18.90 kg/m   Physical Exam Vitals and nursing note reviewed.  HENT:     Head: Atraumatic.     Mouth/Throat:      Mouth: Mucous membranes are moist.     Pharynx: No oropharyngeal exudate or posterior oropharyngeal erythema.  Eyes:     Pupils: Pupils are equal, round, and reactive to light.  Neck:     Comments: May have some mild anterior swelling below the jaw. Cardiovascular:     Rate and Rhythm: Regular rhythm.  Abdominal:     Tenderness: There is  no abdominal tenderness.  Musculoskeletal:        General: No tenderness.  Skin:    General: Skin is warm.     Capillary Refill: Capillary refill takes less than 2 seconds.  Neurological:     Mental Status: She is alert and oriented to person, place, and time.    ED Results / Procedures / Treatments   Labs (all labs ordered are listed, but only abnormal results are displayed) Labs Reviewed  CBC - Abnormal; Notable for the following components:      Result Value   WBC 11.0 (*)    Hemoglobin 11.7 (*)    Platelets 142 (*)    All other components within normal limits  BASIC METABOLIC PANEL - Abnormal; Notable for the following components:   Glucose, Bld 110 (*)    All other components within normal limits  RESP PANEL BY RT-PCR (FLU A&B, COVID) [id number]    EKG None  Radiology CT SOFT TISSUE NECK WO CONTRAST  Result Date: 09/29/2020 CLINICAL DATA:  Initial evaluation for acute neck swelling EXAM: CT NECK WITHOUT CONTRAST TECHNIQUE: Multidetector CT imaging of the neck was performed following the standard protocol without intravenous contrast. COMPARISON:  None available. FINDINGS: Pharynx and larynx: Oral cavity within normal limits. No acute abnormality about the dentition. Palatine tonsils symmetric and within normal limits. Parapharyngeal fat maintained. Nasopharynx and oropharynx within normal limits. There is prominent soft tissue swelling and edema involving the hypopharynx and supraglottic larynx, worse on the right (series 2, image 56). Involvement of the right aryepiglottic fold and epiglottis which is somewhat thickened and edematous.  Secondary narrowing of the supraglottic airway which measures 3 mm in diameter at its most narrow point (series 2, image 57). Associated hazy stranding within the adjacent right neck. No frank retropharyngeal effusion or collection. Glottis is relatively normal in appearance inferiorly. Subglottic airway patent clear. Salivary glands: Salivary glands including the parotid and submandibular glands within normal limits. Thyroid: 5 mm left thyroid nodule, of doubtful significance given size and patient age, no follow-up imaging recommended (ref: J Am Coll Radiol. 2015 Feb;12(2): 143-50).Thyroid otherwise unremarkable. Lymph nodes: No visible enlarged or pathologic adenopathy within the neck. Vascular: Mild atheromatous change about the carotid bifurcations and aortic arch. Vascular structures otherwise limited in assessment given lack of IV contrast. Limited intracranial: Unremarkable. Visualized orbits: Unremarkable. Mastoids and visualized paranasal sinuses: Visualized paranasal sinuses are largely clear. Visualized mastoids and middle ear cavities are well pneumatized and free of fluid. Skeleton: No visible acute osseous finding. No discrete or worrisome osseous lesions. Mild spondylosis noted at C6-7. Upper chest: Visualized upper chest demonstrates no acute finding. 1.6 cm ground-glass nodule present at the peripheral right upper lobe (series 3, image 99). Other: None. IMPRESSION: 1. Prominent soft tissue swelling and edema involving the hypopharynx and supraglottic larynx, worse on the right. Secondary narrowing of the supraglottic airway which measures 3 mm in diameter at its most narrow point. Findings consistent with acute supraglottitis, likely allergic/inflammatory in nature given provided history. No discrete collections. Close clinical monitoring recommended, as this patient is felt to be at risk for potentially developing airway compromise. 2. 1.6 cm ground-glass nodule at the peripheral right upper lobe,  indeterminate. Initial follow-up with CT at 6-12 months is recommended to confirm persistence. If persistent, repeat CT is recommended every 2 years until 5 years of stability has been established. This recommendation follows the consensus statement: Guidelines for Management of Incidental Pulmonary Nodules Detected on CT Images: From the [place]  2017; Radiology 2017; 300:762-263. Electronically Signed   By: Jeannine Boga M.D.   On: 09/29/2020 23:29    Procedures Procedures   Medications Ordered in ED Medications  famotidine (PEPCID) IVPB 20 mg premix (20 mg Intravenous New Bag/Given 09/29/20 2345)  methylPREDNISolone sodium succinate (SOLU-MEDROL) 125 mg/2 mL injection 125 mg (125 mg Intravenous Given 09/29/20 2345)  diphenhydrAMINE (BENADRYL) injection 12.5 mg (12.5 mg Intravenous Given 09/29/20 2345)    ED Course  I have reviewed the triage vital signs and the nursing notes.  Pertinent labs & imaging results that were available during my care of the patient were reviewed by me and considered in my medical decision making (see chart for details).    MDM Rules/Calculators/A&P                          Patient presents with some difficulty swallowing and change in voice.  States there is a sore throat.  Thinks it began after taking a eliglustat this morning.  She is on it for Gaucher's disease.  States she has been told always take it with water and make sure it does not dissolve in her mouth.  Throughout the day feels as if she has been having difficulty breathing and swallowing.  Sore throat.  Now handling her own secretions.  Husband states her voice is different than normal.  May be some swelling below her throat.  CT scan done and showed a supraglottitis.  Infection felt less likely.  Discussed with Dr. Orpah Melter from intensive care.  Will watch in the ICU overnight.  Had been given Decadron Pepcid and Benadryl.  CRITICAL CARE Performed by: Davonna Belling Total critical  care time: 30 minutes Critical care time was exclusive of separately billable procedures and treating other patients. Critical care was necessary to treat or prevent imminent or life-threatening deterioration. Critical care was time spent personally by me on the following activities: development of treatment plan with patient and/or surrogate as well as nursing, discussions with consultants, evaluation of patient's response to treatment, examination of patient, obtaining history from patient or surrogate, ordering and performing treatments and interventions, ordering and review of laboratory studies, ordering and review of radiographic studies, pulse oximetry and re-evaluation of patient's condition.  Final Clinical Impression(s) / ED Diagnoses Final diagnoses:  Supraglottitis with airway obstruction    Rx / DC Orders ED Discharge Orders     None        Davonna Belling, MD 09/30/20 0011

## 2020-09-30 DIAGNOSIS — J0430 Supraglottitis, unspecified, without obstruction: Secondary | ICD-10-CM | POA: Diagnosis not present

## 2020-09-30 DIAGNOSIS — Z880 Allergy status to penicillin: Secondary | ICD-10-CM | POA: Diagnosis not present

## 2020-09-30 DIAGNOSIS — J309 Allergic rhinitis, unspecified: Secondary | ICD-10-CM | POA: Diagnosis present

## 2020-09-30 DIAGNOSIS — Z809 Family history of malignant neoplasm, unspecified: Secondary | ICD-10-CM | POA: Diagnosis not present

## 2020-09-30 DIAGNOSIS — J0431 Supraglottitis, unspecified, with obstruction: Secondary | ICD-10-CM | POA: Diagnosis present

## 2020-09-30 DIAGNOSIS — E7522 Gaucher disease: Secondary | ICD-10-CM | POA: Diagnosis present

## 2020-09-30 DIAGNOSIS — G20 Parkinson's disease: Secondary | ICD-10-CM | POA: Diagnosis present

## 2020-09-30 DIAGNOSIS — R1310 Dysphagia, unspecified: Secondary | ICD-10-CM | POA: Diagnosis present

## 2020-09-30 DIAGNOSIS — Z90710 Acquired absence of both cervix and uterus: Secondary | ICD-10-CM | POA: Diagnosis not present

## 2020-09-30 DIAGNOSIS — Z888 Allergy status to other drugs, medicaments and biological substances status: Secondary | ICD-10-CM | POA: Diagnosis not present

## 2020-09-30 DIAGNOSIS — Z79899 Other long term (current) drug therapy: Secondary | ICD-10-CM | POA: Diagnosis not present

## 2020-09-30 DIAGNOSIS — Z881 Allergy status to other antibiotic agents status: Secondary | ICD-10-CM | POA: Diagnosis not present

## 2020-09-30 DIAGNOSIS — M810 Age-related osteoporosis without current pathological fracture: Secondary | ICD-10-CM | POA: Diagnosis present

## 2020-09-30 DIAGNOSIS — Z8616 Personal history of COVID-19: Secondary | ICD-10-CM | POA: Diagnosis not present

## 2020-09-30 DIAGNOSIS — F411 Generalized anxiety disorder: Secondary | ICD-10-CM | POA: Diagnosis present

## 2020-09-30 LAB — CBC
HCT: 33.9 % — ABNORMAL LOW (ref 36.0–46.0)
Hemoglobin: 11.1 g/dL — ABNORMAL LOW (ref 12.0–15.0)
MCH: 29.2 pg (ref 26.0–34.0)
MCHC: 32.7 g/dL (ref 30.0–36.0)
MCV: 89.2 fL (ref 80.0–100.0)
Platelets: 131 10*3/uL — ABNORMAL LOW (ref 150–400)
RBC: 3.8 MIL/uL — ABNORMAL LOW (ref 3.87–5.11)
RDW: 13.6 % (ref 11.5–15.5)
WBC: 11.4 10*3/uL — ABNORMAL HIGH (ref 4.0–10.5)
nRBC: 0 % (ref 0.0–0.2)

## 2020-09-30 LAB — HIV ANTIBODY (ROUTINE TESTING W REFLEX): HIV Screen 4th Generation wRfx: NONREACTIVE

## 2020-09-30 LAB — PHOSPHORUS: Phosphorus: 2.9 mg/dL (ref 2.5–4.6)

## 2020-09-30 LAB — RESP PANEL BY RT-PCR (FLU A&B, COVID) ARPGX2
Influenza A by PCR: NEGATIVE
Influenza B by PCR: NEGATIVE
SARS Coronavirus 2 by RT PCR: POSITIVE — AB

## 2020-09-30 LAB — BASIC METABOLIC PANEL
Anion gap: 9 (ref 5–15)
BUN: 12 mg/dL (ref 8–23)
CO2: 26 mmol/L (ref 22–32)
Calcium: 9.5 mg/dL (ref 8.9–10.3)
Chloride: 104 mmol/L (ref 98–111)
Creatinine, Ser: 0.5 mg/dL (ref 0.44–1.00)
GFR, Estimated: >60 mL/min (ref >60)
Glucose, Bld: 157 mg/dL — ABNORMAL HIGH (ref 70–99)
Potassium: 3.7 mmol/L (ref 3.5–5.1)
Sodium: 139 mmol/L (ref 135–145)

## 2020-09-30 LAB — PROTIME-INR
INR: 1.1 (ref 0.8–1.2)
Prothrombin Time: 14.4 seconds (ref 11.4–15.2)

## 2020-09-30 LAB — APTT: aPTT: 36 seconds (ref 24–36)

## 2020-09-30 LAB — MAGNESIUM: Magnesium: 2 mg/dL (ref 1.7–2.4)

## 2020-09-30 LAB — GLUCOSE, CAPILLARY
Glucose-Capillary: 139 mg/dL — ABNORMAL HIGH (ref 70–99)
Glucose-Capillary: 168 mg/dL — ABNORMAL HIGH (ref 70–99)
Glucose-Capillary: 120 mg/dL — ABNORMAL HIGH (ref 70–99)

## 2020-09-30 LAB — C-REACTIVE PROTEIN: CRP: 0.7 mg/dL (ref <1.0)

## 2020-09-30 MED ORDER — SODIUM CHLORIDE 0.9 % IV SOLN: 100.0000 mg | Freq: Every day | INTRAVENOUS | Status: DC

## 2020-09-30 MED ORDER — CHLORHEXIDINE GLUCONATE CLOTH 2 % EX PADS
6.0000 | MEDICATED_PAD | Freq: Every day | CUTANEOUS | Status: DC
  Administered 2020-09-30: 6 via TOPICAL

## 2020-09-30 MED ORDER — POLYETHYLENE GLYCOL 3350 17 G PO PACK: 17.0000 g | PACK | Freq: Every day | ORAL | Status: DC | PRN

## 2020-09-30 MED ORDER — DOCUSATE SODIUM 100 MG PO CAPS: 100.0000 mg | ORAL_CAPSULE | Freq: Two times a day (BID) | ORAL | Status: DC | PRN

## 2020-09-30 MED ORDER — ENOXAPARIN SODIUM 40 MG/0.4ML IJ SOSY
40.0000 mg | PREFILLED_SYRINGE | INTRAMUSCULAR | Status: DC
  Administered 2020-09-30 – 2020-10-01 (×2): 40 mg via SUBCUTANEOUS
  Filled 2020-09-30 (×2): qty 0.4

## 2020-09-30 MED ORDER — SODIUM CHLORIDE 0.9 % IV SOLN
1.0000 g | INTRAVENOUS | Status: DC
  Administered 2020-09-30 – 2020-10-01 (×2): 1 g via INTRAVENOUS
  Filled 2020-09-30: qty 10

## 2020-09-30 MED ORDER — MELATONIN 3 MG PO TABS
3.0000 mg | ORAL_TABLET | Freq: Every evening | ORAL | Status: DC | PRN
  Administered 2020-09-30: 3 mg via ORAL
  Filled 2020-09-30: qty 1

## 2020-09-30 MED ORDER — DEXAMETHASONE SODIUM PHOSPHATE 10 MG/ML IJ SOLN
8.0000 mg | Freq: Three times a day (TID) | INTRAMUSCULAR | Status: DC
  Administered 2020-09-30 – 2020-10-01 (×2): 8 mg via INTRAVENOUS
  Filled 2020-09-30 (×3): qty 1

## 2020-09-30 MED ORDER — VANCOMYCIN HCL IN DEXTROSE 1-5 GM/200ML-% IV SOLN
1000.0000 mg | Freq: Once | INTRAVENOUS | Status: AC
  Administered 2020-09-30: 1000 mg via INTRAVENOUS
  Filled 2020-09-30: qty 200

## 2020-09-30 MED ORDER — MELATONIN 3 MG PO TABS: 3.0000 mg | ORAL_TABLET | Freq: Every day | ORAL | Status: DC

## 2020-09-30 MED ORDER — SODIUM CHLORIDE 0.9 % IV SOLN
200.0000 mg | Freq: Once | INTRAVENOUS | Status: DC
  Filled 2020-09-30: qty 40

## 2020-09-30 MED ORDER — VANCOMYCIN HCL 750 MG/150ML IV SOLN
750.0000 mg | Freq: Two times a day (BID) | INTRAVENOUS | Status: DC
  Administered 2020-09-30 – 2020-10-01 (×2): 750 mg via INTRAVENOUS
  Filled 2020-09-30 (×2): qty 150

## 2020-09-30 MED ORDER — DEXAMETHASONE SODIUM PHOSPHATE 10 MG/ML IJ SOLN
5.0000 mg | Freq: Four times a day (QID) | INTRAMUSCULAR | Status: DC
  Administered 2020-09-30 (×2): 5 mg via INTRAVENOUS
  Filled 2020-09-30 (×2): qty 1

## 2020-09-30 NOTE — H&P (Signed)
NAME:  Caitlin Braun, MRN:  [id number], DOB:  24-Mar-1956, LOS: 0 ADMISSION DATE:  09/29/2020, CONSULTATION DATE:  09/30/20 REFERRING MD:  EDP, CHIEF COMPLAINT:  Suprglottitis  History of Present Illness:  This is a 65 yo with history of gauchers (recently diagnosed and followed at [facility]) and parkinsons who presents from outside ED for concerns for supraglottitis. Patient just recently started on Ceredelga 84 mg for her Gauchers. She was told to take it with water and to stand upright after and to ensure to swallow or it could cause side effects. Today she took her medicine and noted that it seemed ot get stuck in the back of her throat. She drank cold fluid throughout the day and even tried to remove it with her fingers which induced spitting up. She continued to have the feeling of burning in the back of her throat and thus came to ED around 7 PM this evening. In the Ed patient was noted to have findings concerning for supraglottitis and thus was transferred to our hospital for further workup and management.   At OSH CBC notable for mild leukocytosis of 11.0, BMP with stable electrolytes, and respiratory panel that was positive for covid.   Patient denies any fevers over the last few days. She states that she tested positive for COVID about 9-10 days ago. At that time did have some cough for 1-2 days, but has not had any cough since. No issues with her breathing. She is boosted with her 4th dose of covid vaccination. No Dysuria. No blood in stools or melena. No hemoptysis or productive cough. No rashes. No nausea or vomiting.     Pertinent  Medical History  Gauchers Allergic rhinitis Parkinsons  Significant Hospital Events: Including procedures, antibiotic start and stop dates in addition to other pertinent events   Admitted for airway surveillance  Interim History / Subjective:  NA  Objective   Blood pressure 105/89, pulse 74, temperature 98.7 F (37.1 C), temperature source Oral,  resp. rate 14, height 5\' 9"  (1.753 m), weight 56.7 kg, SpO2 99 %.        Intake/Output Summary (Last 24 hours) at 09/30/2020 0411 Last data filed at 09/30/2020 0115 Gross per 24 hour  Intake 100 ml  Output --  Net 100 ml   Filed Weights   09/29/20 2150 09/30/20 0300  Weight: 58.1 kg 56.7 kg    Examination: General: Patient alert and oriented. No distress HENT: Oropharynx with no swelling of uvula. Uvula midline Lungs: Clear to auscultation with no wheezing noted. No stridor appreciated Cardiovascular: RRR. No murmurs Abdomen: Soft non tender and non distended Extremities: No deformities. Warm with good pulses.  Neuro: No focal deficits GU: Deferred  Labs/imaging that I have personally reviewed  (right click and "Reselect all SmartList Selections" daily)  CT neck on 09/29/20 that noted     IMPRESSION: 1. Prominent soft tissue swelling and edema involving the hypopharynx and supraglottic larynx, worse on the right. Secondary narrowing of the supraglottic airway which measures 3 mm in diameter at its most narrow point. Findings consistent with acute supraglottitis, likely allergic/inflammatory in nature given provided history. No discrete collections. Close clinical monitoring recommended, as this patient is felt to be at risk for potentially developing airway compromise. 2. 1.6 cm ground-glass nodule at the peripheral right upper lobe, indeterminate. Initial follow-up with CT at 6-12 months is recommended to confirm persistence. If persistent, repeat CT is recommended every 2 years until 5 years of stability has been established.  This recommendation follows the consensus statement: Guidelines for Management of Incidental Pulmonary Nodules Detected on CT Images: From the Fleischner Society 2017; Radiology 2017; 284:228-243.  Resolved Hospital Problem list   NA  Assessment & Plan:  This is a 65 yo female with history as noted above who presents for airway monitoring in  the context of supraglottitis noted on recent CT neck  Suprglottitis-Could be 2/2 to chemical burn (enzyme activity) from ceredelga. However I see no doucmentation of this being a noted adverse event. Spoke with poison control and they are not aware of this being a possible complicaiton either. Could be from her attempts at removing the pill with manual manipulation as well. Finally could be superimposed infection after COVID 9-10 days ago. -Vanc and CTX for possible infectious etiology -COVID has been describe in a case report to do this as well -Decadron 5 q 6 in the event that this is a infflmatory reaction from medication or physical manipulation.  -Blood cultures -Patient to remain NPO this evening.    Covid-We are 9-10 days out. 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 Patient states she has all 3 types of variants.  -Hold Ceredelga for now  -RUL nodule-GGO -Likely 2/2 Covid infection -Repeat CT chest recommended in 6-12 months.     Best Practice (right click and "Reselect all SmartList Selections" daily)   Diet/type: NPO Pain/Anxiety/Delirium protocol Not indicated VAP protocol (if indicated): Not indicated DVT prophylaxis: LMWH GI prophylaxis: N/A Glucose control:  not indicated Central venous access:  N/A Arterial line:  N/A Foley:  N/A Mobility:  bed rest  PT consulted: N/A Studies pending: None Culture data pending:blood Last reviewed culture data:today Antibiotics:ceftriaxone and vanc Antibiotic de-escalation: no,  continue current rx Stop date: to be determined  Code Status:  full code Last date of multidisciplinary goals of care discussion [Spoke with patient this evening and she wishes to be full code. Son is pulmonologist  in [place] who may be helpful if further discussion needed] ccm prognosis: Serious Disposition: remains critically ill, will stay in intensive care   Labs   CBC: Recent Labs  Lab 09/29/20 2219  WBC 11.0*  HGB 11.7*  HCT 36.0  MCV 90.0  PLT 142*    Basic Metabolic Panel: Recent Labs  Lab 09/29/20 2219  NA 138  K 3.7  CL 102  CO2 26  GLUCOSE 110*  BUN 17  CREATININE 0.56  CALCIUM 10.1   GFR: Estimated Creatinine Clearance: 63.6 mL/min (by C-G formula based on SCr of 0.56 mg/dL). Recent Labs  Lab 09/29/20 2219  WBC 11.0*    Liver Function Tests: No results for input(s): AST, ALT, ALKPHOS, BILITOT, PROT, ALBUMIN in the last 168 hours. No results for input(s): LIPASE, AMYLASE in the last 168 hours. No results for input(s): AMMONIA in the last 168 hours.  ABG No results found for: PHART, PCO2ART, PO2ART, HCO3, TCO2, ACIDBASEDEF, O2SAT   Coagulation Profile: No results for input(s): INR, PROTIME in the last 168 hours.  Cardiac Enzymes: No results for input(s): CKTOTAL, CKMB, CKMBINDEX, TROPONINI in the last 168 hours.  HbA1C: Hgb A1c MFr Bld  Date/Time Value Ref Range Status  09/07/2019 10:13 AM 5.7 4.6 - 6.5 % Final    Comment:    Glycemic Control Guidelines for People with Diabetes:Non  Diabetic:  <6%Goal of Therapy: <7%Additional Action Suggested:  >8%     CBG: Recent Labs  Lab 09/30/20 0308  GLUCAP 139*    Review of Systems:   Pertinent positive and negatives per HPI. Otherwise 12 point ROS negative.   Past Medical History:  She,  has a past medical history of GAD (generalized anxiety disorder), Gauchers disease ([place]), Osteoporosis, and Parkinson disease ([place]).   Surgical History:   Past Surgical History:  Procedure Laterality Date   ABDOMINAL HYSTERECTOMY     AUGMENTATION MAMMAPLASTY Bilateral 2016, 1998   Patient had them redone in 2016   COSMETIC SURGERY     face lift     NOSE SURGERY     cosmetic     Social History:   reports that  she has never smoked. She has never used smokeless tobacco. She reports current alcohol use of about 3.0 standard drinks of alcohol per week. She reports current drug use. Frequency: 3.00 times per week. Drug: Marijuana.   Family History:  Her family history includes Cancer in her brother, father, and mother; Other in her daughter. There is no history of Breast cancer or Parkinson's disease.   Allergies Allergies  Allergen Reactions   Augmentin [Amoxicillin-Pot Clavulanate] Hives   Perflutren Lipid Microsphere Other (See Comments)    Back pain   Neupro [Rotigotine] Rash    Allergic to the adhesive      Home Medications  Prior to Admission medications   Medication Sig Start Date End Date Taking? Authorizing Provider  ALPRAZolam Duanne Moron) 0.5 MG tablet Take 1 tablet (0.5 mg total) by mouth 3 (three) times daily as needed. 05/31/20   Melvenia Beam, MD  Biotin w/ Vitamins C & E (HAIR/SKIN/NAILS PO) Take by mouth.    [provider]  Calcium Carb-Cholecalciferol (CALCIUM + D3 PO) Take 500 mg by mouth daily.    [provider]  Carbidopa-Levodopa ER (SINEMET CR) 25-100 MG tablet controlled release Take 1 tablet by mouth 3 (three) times daily as needed. 05/31/20   Melvenia Beam, MD  Cholecalciferol (VITAMIN D3 PO) Take 2,000 Int'l Units by mouth daily.    [provider]  Coenzyme Q10 (CO Q-10) 100 MG CAPS Take by mouth.    [provider]  denosumab (PROLIA) 60 MG/ML SOSY injection Inject 1 mL into the skin every 6 (six) months.    [provider]  Fexofenadine-Pseudoephedrine (ALLEGRA-D 12 HOUR PO) Take 1 tablet by mouth daily.     [provider]  FLUTICASONE PROPIONATE NA Place 1 spray into both nostrils at bedtime.    [provider]  gabapentin (NEURONTIN) 300 MG capsule Take 1 capsule (300 mg total) by mouth 4 (four) times daily as needed. 07/12/20   Melvenia Beam, MD  gabapentin (NEURONTIN) 300 MG capsule Take 1 capsule  (300 mg total) by mouth 3 (three) times daily. 07/11/20   Melvenia Beam, MD  ketotifen (ZADITOR) 0.025 % ophthalmic solution Place 1 drop into both eyes as needed.    [provider]  KRILL OIL PO Take 500 mg by mouth daily.    [provider]  MAGNESIUM PO Take by mouth daily.     [provider]  Minoxidil (ROGAINE EX) Apply 2 drops topically daily.    [provider]  mirtazapine (REMERON) 7.5 MG tablet TAKE 1 TABLET (7.5 MG TOTAL) BY MOUTH AT BEDTIME. 01/26/20   Melvenia Beam, MD  montelukast (SINGULAIR) 10 MG tablet Take 10 mg  by mouth at bedtime.    [provider]  Multiple Vitamin (MULTIVITAMIN) tablet Take 1 tablet by mouth daily.    [provider]  Potassium Gluconate 550 MG TABS Take 1 tablet by mouth daily.    [provider]  Ropinirole HCl 12 MG TB24 TAKE 1 TABLET (12 MG TOTAL) BY MOUTH DAILY. 07/05/20   Melvenia Beam, MD  Wheat Dextrin (BENEFIBER PO) Take by mouth.    [provider]     Critical care time: 55 minutes

## 2020-09-30 NOTE — Consult Note (Signed)
ENT CONSULT:  Reason for Consult: Supraglottic edema on CT neck  Referring Physician:  Dr. Elsworth Soho  HPI: Caitlin Braun is an 65 y.o. female with past medical history of Gaucher's and Parkinson's who presented as transfer from outside ED due to concerns for supraglottitis.  Patient was recently started on a new medication for her Gaucher's and states that she took her medicine yesterday evening and felt as though got stuck in her throat.  She tried drinking warm and cold water, and ultimately tried to remove it with her fingers, which caused emesis.  She had continued burning in the back of her throat and presented to the ED yesterday evening for evaluation.  A CT neck without contrast was obtained, which demonstrated edema in the supraglottis and hypopharynx, and she was transferred to [facility] for further work-up and management.  Patient is stable on room air.  She denies shortness of breath, difficulty breathing.  She denies history of dysphagia or odynophagia prior to this incident.  She also denies previous history of acid reflux disease.  She states that she feels improved today compared to initial presentation.  She is tolerating Jell-O and applesauce without coughing or choking, but does admit to persistent odynophagia.  She endorses voice change.  She is a non-smoker with no tobacco use history.    Past Medical History:  Diagnosis Date   GAD (generalized anxiety disorder)    Gauchers disease ([place])    Osteoporosis    Parkinson disease ([place])     Past Surgical History:  Procedure Laterality Date   ABDOMINAL HYSTERECTOMY     AUGMENTATION MAMMAPLASTY Bilateral 2016, 1998   Patient had them redone in 2016   COSMETIC SURGERY     face lift     NOSE SURGERY     cosmetic    Family History  Problem Relation Age of Onset   Cancer Mother    Cancer Father    Cancer Brother    Other Daughter        carrier of Gauchers.   Breast cancer Neg Hx    Parkinson's disease Neg Hx     Social  History:  reports that she has never smoked. She has never used smokeless tobacco. She reports current alcohol use of about 3.0 standard drinks of alcohol per week. She reports current drug use. Frequency: 3.00 times per week. Drug: Marijuana.  Allergies:  Allergies  Allergen Reactions   Augmentin [Amoxicillin-Pot Clavulanate] Hives   Perflutren Lipid Microsphere Other (See Comments)    Back pain   Neupro [Rotigotine] Rash    Allergic to the adhesive     Medications: I have reviewed the patient's current medications.  Results for orders placed or performed during the hospital encounter of 09/29/20 (from the past 48 hour(s))  CBC     Status: Abnormal   Collection Time: 09/29/20 10:19 PM  Result Value Ref Range   WBC 11.0 (H) 4.0 - 10.5 K/uL   RBC 4.00 3.87 - 5.11 MIL/uL   Hemoglobin 11.7 (L) 12.0 - 15.0 g/dL   HCT 36.0 36.0 - 46.0 %   MCV 90.0 80.0 - 100.0 fL   MCH 29.3 26.0 - 34.0 pg   MCHC 32.5 30.0 - 36.0 g/dL   RDW 13.6 11.5 - 15.5 %   Platelets 142 (L) 150 - 400 K/uL   nRBC 0.0 0.0 - 0.2 %    Comment: Performed at [facility], [address] [place], [place] [address]  Basic metabolic panel  Status: Abnormal   Collection Time: 09/29/20 10:19 PM  Result Value Ref Range   Sodium 138 135 - 145 mmol/L   Potassium 3.7 3.5 - 5.1 mmol/L   Chloride 102 98 - 111 mmol/L   CO2 26 22 - 32 mmol/L   Glucose, Bld 110 (H) 70 - 99 mg/dL    Comment: Glucose reference range applies only to samples taken after fasting for at least 8 hours.   BUN 17 8 - 23 mg/dL   Creatinine, Ser 0.56 0.44 - 1.00 mg/dL   Calcium 10.1 8.9 - 10.3 mg/dL   GFR, Estimated >60 >60 mL/min    Comment: (NOTE) Calculated using the CKD-EPI Creatinine Equation (2021)    Anion gap 10 5 - 15    Comment: Performed at [facility], [address], [place], [place] [address]  Resp Panel by RT-PCR (Flu A&B, Covid) Nasopharyngeal Swab     Status: Abnormal   Collection Time:  09/29/20 11:43 PM   Specimen: Nasopharyngeal Swab; Nasopharyngeal(NP) swabs in vial transport medium  Result Value Ref Range   SARS Coronavirus 2 by RT PCR POSITIVE (A) NEGATIVE    Comment: RESULT CALLED TO, READ BACK BY AND VERIFIED WITH: [facility] RN AT 8502 09/30/20 BY BOWMAN,K (NOTE) SARS-CoV-2 target nucleic acids are DETECTED.  The SARS-CoV-2 RNA is generally detectable in upper respiratory specimens during the acute phase of infection. Positive results are indicative of the presence of the identified virus, but do not rule out bacterial infection or co-infection with other pathogens not detected by the test. Clinical correlation with patient history and other diagnostic information is necessary to determine patient infection status. The expected result is Negative.  Fact Sheet for Patients: EntrepreneurPulse.com.au  Fact Sheet for Healthcare Providers: IncredibleEmployment.be  This test is not yet approved or cleared by the [place] FDA and  has been authorized for detection and/or diagnosis of SARS-CoV-2 by FDA under an Emergency Use Authorization (EUA).  This EUA will remain in effect (meaning this tes t can be used) for the duration of  the COVID-19 declaration under Section 564(b)(1) of the Act, 21 U.S.C. section 360bbb-3(b)(1), unless the authorization is terminated or revoked sooner.     Influenza A by PCR NEGATIVE NEGATIVE   Influenza B by PCR NEGATIVE NEGATIVE    Comment: (NOTE) The Xpert Xpress SARS-CoV-2/FLU/RSV plus assay is intended as an aid in the diagnosis of influenza from Nasopharyngeal swab specimens and should not be used as a sole basis for treatment. Nasal washings and aspirates are unacceptable for Xpert Xpress SARS-CoV-2/FLU/RSV testing.  Fact Sheet for Patients: EntrepreneurPulse.com.au  Fact Sheet for Healthcare Providers: IncredibleEmployment.be  This test is not  yet approved or cleared by the [place] FDA and has been authorized for detection and/or diagnosis of SARS-CoV-2 by FDA under an Emergency Use Authorization (EUA). This EUA will remain in effect (meaning this test can be used) for the duration of the COVID-19 declaration under Section 564(b)(1) of the Act, 21 U.S.C. section 360bbb-3(b)(1), unless the authorization is terminated or revoked.  Performed at [facility], [address], [place], [place] [address]   Glucose, capillary     Status: Abnormal   Collection Time: 09/30/20  3:08 AM  Result Value Ref Range   Glucose-Capillary 139 (H) 70 - 99 mg/dL    Comment: Glucose reference range applies only to samples taken after fasting for at least 8 hours.  HIV Antibody (routine testing w rflx)     Status: None  Collection Time: 09/30/20  3:49 AM  Result Value Ref Range   HIV Screen 4th Generation wRfx Non Reactive Non Reactive    Comment: Performed at [facility], [place] [address], [place], [place] [address]  CBC     Status: Abnormal   Collection Time: 09/30/20  3:49 AM  Result Value Ref Range   WBC 11.4 (H) 4.0 - 10.5 K/uL   RBC 3.80 (L) 3.87 - 5.11 MIL/uL   Hemoglobin 11.1 (L) 12.0 - 15.0 g/dL   HCT 33.9 (L) 36.0 - 46.0 %   MCV 89.2 80.0 - 100.0 fL   MCH 29.2 26.0 - 34.0 pg   MCHC 32.7 30.0 - 36.0 g/dL   RDW 13.6 11.5 - 15.5 %   Platelets 131 (L) 150 - 400 K/uL    Comment: REPEATED TO VERIFY   nRBC 0.0 0.0 - 0.2 %    Comment: Performed at [facility], [place] [address], [place], [place] [address]  Basic metabolic panel     Status: Abnormal   Collection Time: 09/30/20  3:49 AM  Result Value Ref Range   Sodium 139 135 - 145 mmol/L   Potassium 3.7 3.5 - 5.1 mmol/L   Chloride 104 98 - 111 mmol/L   CO2 26 22 - 32 mmol/L   Glucose, Bld 157 (H) 70 - 99 mg/dL    Comment: Glucose reference range applies only to samples taken after fasting for at least 8 hours.   BUN 12 8 - 23 mg/dL   Creatinine,  Ser 0.50 0.44 - 1.00 mg/dL   Calcium 9.5 8.9 - 10.3 mg/dL   GFR, Estimated >60 >60 mL/min    Comment: (NOTE) Calculated using the CKD-EPI Creatinine Equation (2021)    Anion gap 9 5 - 15    Comment: Performed at [facility] [address], [place], [place] [address]  Magnesium     Status: None   Collection Time: 09/30/20  3:49 AM  Result Value Ref Range   Magnesium 2.0 1.7 - 2.4 mg/dL    Comment: Performed at [facility] [address], [place], [place] [address]  Phosphorus     Status: None   Collection Time: 09/30/20  3:49 AM  Result Value Ref Range   Phosphorus 2.9 2.5 - 4.6 mg/dL    Comment: Performed at [facility] [address], [place], [place] [address]  Protime-INR     Status: None   Collection Time: 09/30/20  3:49 AM  Result Value Ref Range   Prothrombin Time 14.4 11.4 - 15.2 seconds   INR 1.1 0.8 - 1.2    Comment: (NOTE) INR goal varies based on device and disease states. Performed at [facility], [place] [address], [place], [place] [address]   APTT     Status: None   Collection Time: 09/30/20  3:49 AM  Result Value Ref Range   aPTT 36 24 - 36 seconds    Comment: Performed at [facility] [address] [place],  [address]  C-reactive protein     Status: None   Collection Time: 09/30/20  3:49 AM  Result Value Ref Range   CRP 0.7 <1.0 mg/dL    Comment: Performed at [facility] [address], [place], [place] [address]  Glucose, capillary     Status: Abnormal   Collection Time: 09/30/20  7:36 AM  Result Value Ref Range   Glucose-Capillary 168 (H) 70 -  99 mg/dL    Comment: Glucose reference range applies only to samples taken after fasting for at least 8 hours.    CT SOFT TISSUE NECK WO CONTRAST  Result Date: 09/29/2020 CLINICAL DATA:  Initial evaluation for acute neck swelling EXAM: CT NECK WITHOUT CONTRAST TECHNIQUE: Multidetector CT imaging of the neck was performed following the standard protocol without  intravenous contrast. COMPARISON:  None available. FINDINGS: Pharynx and larynx: Oral cavity within normal limits. No acute abnormality about the dentition. Palatine tonsils symmetric and within normal limits. Parapharyngeal fat maintained. Nasopharynx and oropharynx within normal limits. There is prominent soft tissue swelling and edema involving the hypopharynx and supraglottic larynx, worse on the right (series 2, image 56). Involvement of the right aryepiglottic fold and epiglottis which is somewhat thickened and edematous. Secondary narrowing of the supraglottic airway which measures 3 mm in diameter at its most narrow point (series 2, image 57). Associated hazy stranding within the adjacent right neck. No frank retropharyngeal effusion or collection. Glottis is relatively normal in appearance inferiorly. Subglottic airway patent clear. Salivary glands: Salivary glands including the parotid and submandibular glands within normal limits. Thyroid: 5 mm left thyroid nodule, of doubtful significance given size and patient age, no follow-up imaging recommended (ref: J Am Coll Radiol. 2015 Feb;12(2): 143-50).Thyroid otherwise unremarkable. Lymph nodes: No visible enlarged or pathologic adenopathy within the neck. Vascular: Mild atheromatous change about the carotid bifurcations and aortic arch. Vascular structures otherwise limited in assessment given lack of IV contrast. Limited intracranial: Unremarkable. Visualized orbits: Unremarkable. Mastoids and visualized paranasal sinuses: Visualized paranasal sinuses are largely clear. Visualized mastoids and middle ear cavities are well pneumatized and free of fluid. Skeleton: No visible acute osseous finding. No discrete or worrisome osseous lesions. Mild spondylosis noted at C6-7. Upper chest: Visualized upper chest demonstrates no acute finding. 1.6 cm ground-glass nodule present at the peripheral right upper lobe (series 3, image 99). Other: None. IMPRESSION: 1.  Prominent soft tissue swelling and edema involving the hypopharynx and supraglottic larynx, worse on the right. Secondary narrowing of the supraglottic airway which measures 3 mm in diameter at its most narrow point. Findings consistent with acute supraglottitis, likely allergic/inflammatory in nature given provided history. No discrete collections. Close clinical monitoring recommended, as this patient is felt to be at risk for potentially developing airway compromise. 2. 1.6 cm ground-glass nodule at the peripheral right upper lobe, indeterminate. Initial follow-up with CT at 6-12 months is recommended to confirm persistence. If persistent, repeat CT is recommended every 2 years until 5 years of stability has been established. This recommendation follows the consensus statement: Guidelines for Management of Incidental Pulmonary Nodules Detected on CT Images: From the Fleischner Society 2017; Radiology 2017; 284:228-243. Electronically Signed   By: Jeannine Boga M.D.   On: 09/29/2020 23:29    ROS:ROS  Blood pressure 108/70, pulse 71, temperature 97.8 F (36.6 C), temperature source Oral, resp. rate 14, height 5\' 9"  (1.753 m), weight 56.7 kg, SpO2 97 %.  PHYSICAL EXAM: CONSTITUTIONAL: well developed, nourished, no distress and alert and oriented x 3 PULMONARY/CHEST WALL: effort normal and no stridor, no stertor, dysphonia noted HENT: Head : normocephalic and atraumatic Ears: Right ear:   canal normal, external ear normal and hearing normal Left ear:   canal normal, external ear normal and hearing normal Nose: nose normal and no purulence Mouth/Throat:  Mouth: uvula midline and no oral lesions.  Floor of mouth soft, no edema. Throat: oropharynx clear and moist.  Tonsils symmetric.  No abnormality  of hard or soft palate. Mucous membranes: normal EYES: conjunctiva normal, EOM normal and PERRL NECK: supple, trachea normal and no thyromegaly or cervical LAD  Procedure: Transnasal fiberoptic  laryngoscopy Anesth: Topical with 4% lidocaine Compl: None Findings: Normal nasal cavity, with normal fossa of Rosenmuller bilaterally.  Clear nasal drainage and copious clear secretions noted, no purulence appreciated. Patient had circumferential narrowing of her oropharynx secondary to muscular contracture, oropharyngeal mucosa appeared normal, no edema or mucosal lesions noted.  Moderate edema of bilateral arytenoids noted, slightly more prominent on the right.  Epiglottis appeared normal, with no significant edema.  True vocal fold movement appeared normal, assessment slightly limited secondary to supraglottic edema and patient discomfort.  Description:  After discussing risks, benefits, and alternatives, the patient was placed in a seated position and bilateral nasal passages were sprayed with topical anesthetic.  The fiberoptic scope was passed through the left nasal passage to view the pharynx and larynx.  Findings are noted above.  The scope was then removed and he was returned to nursing care in stable condition.  Studies Reviewed: CT neck without contrast reviewed, edema, greater on right noted of supraglottis.  Assessment/Plan: Caitlin Braun is a 65 y/o F with past medical history of Gaucher's and Parkinson's disease presenting with dysphagia, odynophagia and burning throat sensation following choking on her medication yesterday and subsequent attempts to dislodge the pill, inducing emesis.  -Flexible nasolaryngoscopy performed at the bedside today with findings as above -Recommend speech-language pathology consultation for swallow assessment -Recommend increasing Decadron to 8 mg every 8 hours for 3 total doses -Recommend beginning PPI, continue famotidine.  Recommend discharge with prescription for daily PPI for 60 days.  -Continue antihistamine -Recommend nasal saline rinse 3 times daily -Likely stable for discharge tomorrow pending continued clinical stability, tolerance of oral  diet -Recommend follow-up with ENT in 1 to 2 months for recheck  Thank you for allowing me to participate in the care of this patient. Please do not hesitate to contact me with any questions or concerns.   Jason Coop, [facility] Cell: [phone]   09/30/2020, 12:02 PM

## 2020-09-30 NOTE — Progress Notes (Signed)
   No complaints Speech improved, able to swallow fluids  Vitals:   09/30/20 1100 09/30/20 1116  BP: 108/70   Pulse: 71   Resp: 14   Temp:  97.8 F (36.6 C)  SpO2: 97%    On exam-no accessory muscle use, clear breath sounds, no stridor, S1-S2 regular, good strength all 4 extremities  CT imaging reviewed Labs show chronic anemia and thrombocytopenia very mild leukocytosis  Impression/plan Supraglottitis -does not appear to be angioedema or allergic reaction, feels more likely mechanical issue.  Have asked ENT to consult. Continue Decadron -Empiric antibiotics  COVID infection -confirmed from patient this was approximately 10 days ago, she is not sure of exact date so we will continue isolation during hospital stay  Right upper lobe nodule -repeat short-term CT scan in 3 months  Updated patient and her son who is a Forensic psychologist in [place]. Elsworth Soho MD

## 2020-09-30 NOTE — ED Notes (Signed)
Dr. Florina Ou aware that pt is covid positive.

## 2020-09-30 NOTE — Progress Notes (Signed)
[facility] Progress Note Patient Name: Zane Samson DOB: 1955/11/20 MRN: [id number]   Date of Service  09/30/2020  HPI/Events of Note  Brief HPI: Cc; difficulty swallowing and sob.  65 yr old female with hx of PD, Gauchers disease, muscle weakness transferred from OSH ED for non obstructive supraglottitis-allergic reaction post eliglustat ( for her Gauchers disease, on it for month or 2). Got Decadron Pepcid and Benadryl.   Notes, labs reviewed. Data: Reviewed Hg 11.7, flu neg, Covid +.  CT soft : neck: IMPRESSION: 1. Prominent soft tissue swelling and edema involving the hypopharynx and supraglottic larynx, worse on the right. Secondary narrowing of the supraglottic airway which measures 3 mm in diameter at its most narrow point. Findings consistent with acute supraglottitis, likely allergic/inflammatory in nature given provided history. No discrete collections. Close clinical monitoring recommended, as this patient is felt to be at risk for potentially developing airway compromise. 2. 1.6 cm ground-glass nodule at the peripheral right upper lobe, indeterminate. Initial follow-up with CT at 6-12 months is recommended to confirm persistence.  Camera: VS stable. On room air, sats 100%. Afebrile. MAP 69, HR 76.       eICU Interventions  - watch for airway compromise. Talking to bed side RN w/o stridor. RR is OK S/p methypred 125, benadyl and pepcid from OSH. Consider ENT eval in AM, if not better Aspiration precautions. Keep NPO. -CCM MD ordering anti viral/dexa for covid. Get  CxR if getting hypoxemia.    VTE :lovenox.  CBG goals < 180     Intervention Category Major Interventions: Respiratory failure - evaluation and management Evaluation Type: New Patient Evaluation  Elmer Sow 09/30/2020, 3:07 AM

## 2020-09-30 NOTE — Progress Notes (Signed)
[facility] Progress Note Patient Name: Adalene Gulotta DOB: 1955/10/29 MRN: [id number]   Date of Service  09/30/2020  HPI/Events of Note  Patient requests sleep aid.  eICU Interventions  Plan: Melatonin 3 mg PO Q HS PRN sleep.     Intervention Category Major Interventions: Other:  Lysle Dingwall 09/30/2020, 10:48 PM

## 2020-09-30 NOTE — Progress Notes (Signed)
Pharmacy Antibiotic Note  Princes Finger is a 65 y.o. female admitted on 09/29/2020 with  supraglottitis .  Pharmacy has been consulted for vancomycin dosing.  Plan: Vancomycin 1gm IV x 1 then 750 mg IV q12 hours F/u renal function, cultures and clinical course  Height: 5\' 9"  (175.3 cm) Weight: 56.7 kg (125 lb) IBW/kg (Calculated) : 66.2  Temp (24hrs), Avg:98.7 F (37.1 C), Min:98.6 F (37 C), Max:98.7 F (37.1 C)  Recent Labs  Lab 09/29/20 2219  WBC 11.0*  CREATININE 0.56    Estimated Creatinine Clearance: 63.6 mL/min (by C-G formula based on SCr of 0.56 mg/dL).    Allergies  Allergen Reactions   Augmentin [Amoxicillin-Pot Clavulanate] Hives   Perflutren Lipid Microsphere Other (See Comments)    Back pain   Neupro [Rotigotine] Rash    Allergic to the adhesive     Thank you for allowing pharmacy to be a part of this patient's care.  Excell Seltzer Poteet 09/30/2020 3:47 AM

## 2020-10-01 ENCOUNTER — Ambulatory Visit: Payer: Self-pay | Admitting: Neurology

## 2020-10-01 MED ORDER — FAMOTIDINE 20 MG PO TABS: 20.0000 mg | ORAL_TABLET | Freq: Two times a day (BID) | ORAL | 1 refills | Status: DC

## 2020-10-01 MED ORDER — FAMOTIDINE 20 MG PO TABS
20.0000 mg | ORAL_TABLET | Freq: Two times a day (BID) | ORAL | Status: DC
  Administered 2020-10-01: 20 mg via ORAL
  Filled 2020-10-01: qty 1

## 2020-10-01 MED ORDER — PANTOPRAZOLE SODIUM 40 MG PO TBEC: 40.0000 mg | DELAYED_RELEASE_TABLET | Freq: Every day | ORAL | 1 refills | Status: DC

## 2020-10-01 MED ORDER — PANTOPRAZOLE SODIUM 40 MG PO TBEC
40.0000 mg | DELAYED_RELEASE_TABLET | Freq: Every day | ORAL | Status: DC
  Administered 2020-10-01: 40 mg via ORAL
  Filled 2020-10-01: qty 1

## 2020-10-01 NOTE — Progress Notes (Signed)
Pt belonging with patient discharge instructions given verbal understanding. Pt taken via w/c to husband

## 2020-10-01 NOTE — Discharge Summary (Addendum)
Physician Discharge Summary   Patient ID: Caitlin Braun [id number] y.o. [birth year]  Admit date: 09/29/2020  Discharge date and time: 10/01/20 1400   Admitting Physician: Caitlin Jaksch, MD   Discharge Physician: Caitlin Clam, MD  Admission Diagnoses: Supraglottitis with airway obstruction [J04.31]  Discharge Diagnoses: as above  Admission Condition: critical  Discharged Condition: fair  Indication for Admission: airway protection  Hospital Course:   This is a 65 yo with history of gauchers (recently diagnosed and followed at [facility]) and parkinsons who presents from outside ED for concerns for supraglottitis. Took new med Ceredelga and felt lie it got stuck in throat. Tried drinking water. Still felt stuck. Tried to remove with fingers. Went to ED> CT neck with "prominent soft tissue swelling and edema involving the hypopharynx and supraglottic larynx, worse on the right (series 2, image 56). Involvement of the right aryepiglottic fold and epiglottis which is somewhat thickened and edematous. Secondary narrowing of the supraglottic airway which measures 3 mm in diameter at its most narrow point." ENT was consulted. Scope revealed "Circumferential narrowing of her oropharynx secondary to muscular contracture, oropharyngeal mucosa appeared normal, no edema or mucosal lesions noted.  Moderate edema of bilateral arytenoids noted, slightly more prominent on the right.  Epiglottis appeared normal, with no significant edema.  True vocal fold movement appeared normal, assessment slightly limited secondary to supraglottic edema and patient discomfort." Given liquid diet without issue. Received decadron per ENT recommendations. SLP eval and swallow was ok. Tolerated a regular diet. To follow up with ENT as below. PPI and H2 blockers prescribed per ENT recommendations.   Consults:  ENT  Significant Diagnostic Studies: flexible laryngoscopy via ENT  Treatments:  decadron  Discharge Exam: BP 113/71   Pulse 73   Temp (!) 97.5 F (36.4 C) (Oral)   Resp 14   Ht 5\' 9"  (1.753 m)   Wt 56.7 kg   SpO2 96%   BMI 18.46 kg/m   General Appearance:    Alert, cooperative, no distress, appears stated age  Head:    Normocephalic, without obvious abnormality, atraumatic  Eyes:    conjunctiva/corneas clear, EOM's intact     Nose:   Nares normal, septum midline  Throat:   Lips, mucosa, and tongue normal; teeth and gums normal        Lungs:     On RA, respirations unlabored      Heart:    Warm, RR on monitor                              Disposition:   Patient Instructions:  Allergies as of 10/01/2020       Reactions   Augmentin [amoxicillin-pot Clavulanate] Hives   Perflutren Lipid Microsphere Other (See Comments)   Back pain   Neupro [rotigotine] Rash   Allergic to the adhesive         Medication List     STOP taking these medications    montelukast 10 MG tablet Commonly known as: SINGULAIR       TAKE these medications    ALPRAZolam 0.5 MG tablet Commonly known as: XANAX Take 1 tablet (0.5 mg total) by mouth 3 (three) times daily as needed.   BENEFIBER PO Take by mouth.   CALCIUM + D3 PO Take 500 mg by mouth daily.   Carbidopa-Levodopa ER 25-100 MG tablet controlled release Commonly known as: SINEMET CR Take 1 tablet by mouth 3 (three)  times daily as needed.   Cerdelga 84 MG Caps Generic drug: Eliglustat Tartrate Take 84 mg by mouth 2 (two) times daily.   Co Q-10 100 MG Caps Take by mouth.   famotidine 20 MG tablet Commonly known as: PEPCID Take 1 tablet (20 mg total) by mouth 2 (two) times daily.   FLUTICASONE PROPIONATE NA Place 1 spray into both nostrils at bedtime.   gabapentin 300 MG capsule Commonly known as: NEURONTIN Take 1 capsule (300 mg total) by mouth 4 (four) times daily as needed. What changed: Another medication with the same name was removed. Continue taking this medication, and follow  the directions you see here.   hydroquinone 4 % cream Apply 1 application topically at bedtime.   ketotifen 0.025 % ophthalmic solution Commonly known as: ZADITOR Place 1 drop into both eyes as needed.   levocetirizine 5 MG tablet Commonly known as: XYZAL Take 5 mg by mouth daily.   mirtazapine 7.5 MG tablet Commonly known as: REMERON TAKE 1 TABLET (7.5 MG TOTAL) BY MOUTH AT BEDTIME.   multivitamin tablet Take 1 tablet by mouth daily.   pantoprazole 40 MG tablet Commonly known as: Protonix Take 1 tablet (40 mg total) by mouth daily.   Prolia 60 MG/ML Sosy injection Generic drug: denosumab Inject 1 mL into the skin every 6 (six) months.   ROGAINE EX Apply 2 drops topically daily.   Ropinirole HCl 12 MG Tb24 TAKE 1 TABLET (12 MG TOTAL) BY MOUTH DAILY.   tretinoin 0.025 % cream Commonly known as: RETIN-A Apply 1 application topically at bedtime as needed.       Activity: activity as tolerated Diet: regular diet Wound Care: none needed  Follow-up with ENT in  1-2  months.  SignedBonna Gains Tinita Braun 10/01/2020 11:27 AM

## 2020-10-01 NOTE — Evaluation (Signed)
Clinical/Bedside Swallow Evaluation Patient Details  Name: Caitlin Braun MRN: [id number] Date of Birth: [birth year]  Today's Date: 10/01/2020 Time: SLP Start Time (ACUTE ONLY): 14 SLP Stop Time (ACUTE ONLY): 8469 SLP Time Calculation (min) (ACUTE ONLY): 17 min  Past Medical History:  Past Medical History:  Diagnosis Date   GAD (generalized anxiety disorder)    Gauchers disease ([place])    Osteoporosis    Parkinson disease ([place])    Past Surgical History:  Past Surgical History:  Procedure Laterality Date   ABDOMINAL HYSTERECTOMY     AUGMENTATION MAMMAPLASTY Bilateral 2016, 1998   Patient had them redone in 2016   COSMETIC SURGERY     face lift     NOSE SURGERY     cosmetic   HPI:  This is a 65 yo with history of gauchers and parkinsons who presents from outside ED for concerns for supraglottitis. Patient just recently started on Ceredelga 84 mg for her Gauchers. She was told to take it with water and to stand upright after and to ensure to swallow or it could cause side effects. Today she took her medicine and noted that it seemed ot get stuck in the back of her throat. She drank cold fluid throughout the day and even tried to remove it with her fingers which induced spitting up. She continued to have the feeling of burning in the back of her throat and thus came to ED around 7 PM this evening. In the Ed patient was noted to have findings concerning for supraglottitis and thus was transferred to our hospital for further workup and management. ENT scoped and found "Normal nasal cavity, with normal fossa of Rosenmuller bilaterally.  Clear nasal drainage and copious clear secretions noted, no purulence appreciated. Patient had circumferential narrowing of her oropharynx secondary to muscular contracture, oropharyngeal mucosa appeared normal, no edema or mucosal lesions noted.  Moderate edema of bilateral arytenoids noted, slightly more prominent on the right.  Epiglottis appeared normal,  with no significant edema.  True vocal fold movement appeared normal, assessment slightly limited secondary to supraglottic edema and patient discomfort."   Assessment / Plan / Recommendation Clinical Impression  Pt presents with functional swallowing as assessed clinically.  There were no clinical s/s of aspiration with any consistencies trialed and pt exhibited good oral clearance of solids.  Pt also took 2 small pills one at a time with water without any difficulty.  Pt c/o odynophagia/"rough" throat.  Advised pt to make diet choices to reduce irritation to mucosa (avoid spicy foods, acidic foods, choose softer items as needed).  Pt has no further ST needs at this time.  SLP wil sign off.  Recommend regular texture diet with thin liquids  SLP Visit Diagnosis: Dysphagia, unspecified (R13.10)    Aspiration Risk  No limitations    Diet Recommendation Regular;Thin liquid   Liquid Administration via: Cup;Straw Medication Administration: Whole meds with liquid Supervision: Patient able to self feed Compensations: Slow rate;Small sips/bites Postural Changes: Seated upright at 90 degrees    Other  Recommendations Oral Care Recommendations: Oral care BID   Follow up Recommendations None      Frequency and Duration  (N/A)          Prognosis Prognosis for Safe Diet Advancement:  (N/A)      Swallow Study   General HPI: This is a 65 yo with history of gauchers and parkinsons who presents from outside ED for concerns for supraglottitis. Patient just recently started on Ceredelga 84 mg  for her Gauchers. She was told to take it with water and to stand upright after and to ensure to swallow or it could cause side effects. Today she took her medicine and noted that it seemed ot get stuck in the back of her throat. She drank cold fluid throughout the day and even tried to remove it with her fingers which induced spitting up. She continued to have the feeling of burning in the back of her throat  and thus came to ED around 7 PM this evening. In the Ed patient was noted to have findings concerning for supraglottitis and thus was transferred to our hospital for further workup and management. ENT scoped and found "Normal nasal cavity, with normal fossa of Rosenmuller bilaterally.  Clear nasal drainage and copious clear secretions noted, no purulence appreciated. Patient had circumferential narrowing of her oropharynx secondary to muscular contracture, oropharyngeal mucosa appeared normal, no edema or mucosal lesions noted.  Moderate edema of bilateral arytenoids noted, slightly more prominent on the right.  Epiglottis appeared normal, with no significant edema.  True vocal fold movement appeared normal, assessment slightly limited secondary to supraglottic edema and patient discomfort." Type of Study: Bedside Swallow Evaluation Previous Swallow Assessment: none Diet Prior to this Study: Regular (previously on clears) Temperature Spikes Noted: No Respiratory Status: Room air History of Recent Intubation: No Behavior/Cognition: Alert;Cooperative;Pleasant mood Oral Cavity Assessment: Within Functional Limits Oral Care Completed by SLP: No Oral Cavity - Dentition: Adequate natural dentition Vision: Functional for self-feeding Self-Feeding Abilities: Able to feed self Patient Positioning: Upright in bed Baseline Vocal Quality: Normal Volitional Cough: Strong Volitional Swallow: Able to elicit    Oral/Motor/Sensory Function Overall Oral Motor/Sensory Function: Within functional limits Facial ROM: Within Functional Limits Facial Symmetry: Within Functional Limits Lingual ROM: Within Functional Limits Lingual Symmetry: Within Functional Limits Lingual Strength: Within Functional Limits Velum: Within Functional Limits Mandible: Within Functional Limits   Ice Chips Ice chips: Not tested   Thin Liquid Thin Liquid: Within functional limits Presentation: Straw    Nectar Thick Nectar Thick  Liquid: Not tested   Honey Thick Honey Thick Liquid: Not tested   Puree Puree: Within functional limits Presentation: Spoon   Solid     Solid: Within functional limits Presentation: [place], [place], [place] Office: [phone] 10/01/2020,10:03 AM

## 2020-10-04 ENCOUNTER — Ambulatory Visit: Payer: 59 | Admitting: Neurology

## 2020-10-04 ENCOUNTER — Telehealth: Payer: Self-pay | Admitting: Pulmonary Disease

## 2020-10-04 NOTE — Telephone Encounter (Signed)
Called and left detailed message for patient (DPR) regarding PA request for pantoprazole.  We need to clarify your insurance information and get the Rx BIN # and Rx PCN # from her card.  I attempted to do a PA using [employer] and [employer] earlier today and neither could find patient to verify eligibility.  I confirmed with [employer] that this information is inactive.  I spoke with her pharmacy and was told that [facility] is what they have on file for her.  Will await return call to verify insurance information.

## 2020-10-04 NOTE — Telephone Encounter (Signed)
PA request was received from (pharmacy): [facility] Phone:[phone] Fax: [phone] Medication name and strength: Pantoprazole sodium 40 mg DR tablets Ordering Provider: [place]  Was PA started with CMM?: yes If yes, please enter KEY: [id number] Medication tried and failed: Pepcid 20 mg Covered Alternatives: unknown  PA sent to plan, time frame for approval / denial: Click the "Send to Plan" button to submit this information to [employer]. (If it is disabled, be sure all required fields have been completed.) This electronic submission does not require a signature. [employer] will respond automatically with your next steps. Additional Information Required [employer] does not review prior authorization for this plan. Please refer to the phone number on the back of the prescription ID card.  Called [phone] to start a PA, spoke with Judeen Hammans.  After a long hold, I was told her account was inactive. Called [facility] pharmacy and spoke with Summer and she stated that the patient has [place]  Started new form: BLT#[id number], no PCN # Unable to verify eligibility Key:  [id number]  Routing to [place] for follow-up

## 2020-10-05 LAB — CULTURE, BLOOD (ROUTINE X 2)
Culture: NO GROWTH
Culture: NO GROWTH
Special Requests: ADEQUATE
Special Requests: ADEQUATE

## 2020-10-11 NOTE — Telephone Encounter (Signed)
I have attempted to complete the PA for the protonix.  It stated that there is no eligibility found.  I have called the pt and LM on VM for her to call back about her insurance.  If she can upload a pic of her card (front and back) into mychart.

## 2020-10-12 NOTE — Telephone Encounter (Signed)
Patient returned phone call. Will upload insurance card into [place]. Patient phone number is [phone].

## 2020-10-12 NOTE — Telephone Encounter (Signed)
Will route back to leigh as she is the one working on the [employer], just [place]. Thanks!

## 2020-10-23 ENCOUNTER — Ambulatory Visit: Payer: BC Managed Care – PPO | Admitting: Neurology

## 2020-10-23 ENCOUNTER — Other Ambulatory Visit: Payer: Self-pay | Admitting: Pulmonary Disease

## 2020-10-23 VITALS — BP 105/71 | HR 85 | Ht 69.0 in | Wt 132.0 lb

## 2020-10-23 DIAGNOSIS — G20 Parkinson's disease: Secondary | ICD-10-CM

## 2020-10-23 DIAGNOSIS — E7522 Gaucher disease: Secondary | ICD-10-CM | POA: Diagnosis not present

## 2020-10-23 NOTE — Progress Notes (Signed)
[facility]    Provider:  Dr Caitlin Braun  CC:  Parkinson's disease:  moving to [place] in August, will try to find a neurologist, sent an email to my neurology group for recommendations  10/23/2020: She is doing well. She take Sinemet as needed. She takes the rest at bedtime. She just came back from [place], she walked heavily for many days, saw to her grandchildren. She Is on Cerdelga for Gaucher's disease and had a reaction to it and was in the ICU for 2 days with esophageal swelling on IV steroids.   Interval history 05/31/2020: Patient is here for follow-up of Parkinson's disease.  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  We also discussed Sinemet, she is on the extended release once a day, this was prescribed by [facility] Dr. Deboraha Braun, we discussed IR versus extended release, at this time I will keep her on the extended release because that is what Dr. Deboraha Braun prescribed to her, we discussed the difference between wearing off or ineffectiveness, at this time she feels the medication is working but wears off, she feels shaky inside, we discussed strategies whether to increase the medication as far as frequency or dosage, at this time we will add another dose around 1 PM and she could add another dose in the evening if needed, with her husband today, answered all questions.  Exam is stable.  Interval history November 15, 2019: Patient here for follow-up of Parkinson's disease.  Patient was seen by the movement disorder team at [facility] in May of this year and I reviewed those notes: She reports symptoms began in 2018 with right lower extremity tremor, right-sided weakness and difficulty with typing and handwriting, she went extensive work-up in 2019 to early 2020 by her PCP, neurosurgery, rheumatology and neurology ultimately diagnosed with Parkinson's disease  after a DaTscan was positive, pramipexole made her too tired, Neupro patch helped but she developed a rash and was switched to ropinirole which she was currently taking at this appointment.  She denied rigidity, falls, balance issues, dizziness or gait freezing, speech changes, sialorrhea, choking or swallowing, hallucinations, mood or cognitive concerns or impulsive behaviors.  U PDR S score was 21, Examination showed minimal symmetric bradykinesia, no rigidity and resting right lower extremity tremor consistent with diagnosis of Parkinson's disease supported by her abnormal DaTscan's and response to ropinirole, her ropinirole was increased from 6 mg to 8 mg, gabapentin was continued, melatonin was recommended and mirtazapine was suggested to be decreased but restart if she does notice worsening of anxiety or sleep problems, DBS not recommended at that time but patient be an excellent candidate in the future.  They also discussed genetic testing for Parkinson's disease, daily exercise.  She will follow-up with Dr. Deboraha Braun annually.  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 We discussed follow up with hematology and I sent a message to Dr. Jana Braun regarding patient.    Interval history 05/18/2019: She moves a little slower. She tremors when she is overtired. We discussed [facility] movement disorders team, we also discussed Education officer, museum at [employer] [facility], they do not feel the social worker will help, Patient would like to be referred to a movement disorder specialist clinic and when appropriate they would like DBS, will refer to [facility] Dr. Donna Braun team. She  really likes neupro but despite the thngs we tried, we could not resolve her rash on the pastch. We have changed to oral DA and she feels better, she has some swelling and also feels she is a little bloated but tolerating well,  she had a colonoscopy and she is fine, discussed association with skin cancers she needs to get a check up regularly. Discussed with husband. She has no REM sleep disorder. She feels stable, sleeping better. Can give her limited xanax.   Interval history 11/15/2018: She has tingling n the back area, scratching helps it. Since she saw me Caitlin Braun, who is a movement disorder specialist. The specialist recommend mirtazapine and Gabapentin. Sleeping is not better. She is on 65m Neuro patch and doing well. Start with the Gabapentin, decrease ambien at night. Also in a few weeks depending on how doing may switch mirtazapine or add it to the regimen but beware of sedation, discussed at length but decrease ambien and woul dlike to get her off of the [place] with studies associating   Patient complains of symptoms per HPI as well as the following symptoms: Feels tremors. Pertinent negatives and positives per HPI. All others negative   Interval history Aug 25, 2018: I connected with this lovely patient and her husband today to follow-up on her Parkinson's disease.  She was started on oral dopamine agonists and had a sleep attack she was switched to the Neupro patch which is working quite well for her.  She feels as though she could increase it.  On the 2 mg, felt better. She still has insomnia but that is not new. She feels pressure on her bladder at night, she took peridium and it helped. I recommended she have her urine tested, she went to her pcp and he tested her urine, she had these symptoms prior to starting the medication and has been working with her pcp. She bought a bike for exercise. Sheis exercising daily for 30 minutes. She is also going to start boxing. She feels she needs an increase to her neupro, will increase to 375m She has tingling in the leg with radicular appointments. Asked her to fu with stern, I don;t have access to the mr lumbar spine. Will repeat CT of the head to look at the focus  in the brain in 6 months. Discussed PFO, follow with primary care.  Discussed her use of Ambien, to reports have shown association with Parkinson's disease, at this point will change to clonazepam which is also used for REM sleep disorder.  Addendum 06/03/2018: DAT Scan: IMPRESSION:Loss of dopamine transport activity in the bilateral striata is a pattern typical of Parkinson's syndrome pathology  Addendum 05/20/2018: Patient with reported low back pain with radiculopathy of right leg, right arm and leg weakness and sensory changes, diffuse pain. Reviewed workup with patient today. EMG/NCS within normal limits. She still has episodes of leg tremoring but she is feeling better and if she tells herself to stop she can stop it. She also still feels symptomatic in her right arm and right leg. But symptoms happen separately so may not be related. She went to physical therapy and she loves it. She is going to start Pilates. Reviewed MRI of the brain and thoracic imaging and labwork to date with patient which is extensive (see below). [facility]. Her worst symptoms are the tingling and numbness in the prox arm and legs and the tremor. She  asks me for a refill of Ambien, I will give her one refill of ambien but I do not agree with long-term use so she will have to see her primary care to discuss. Needs a referral to a sleep counselor.  Patient needs cognitive behavioral therapy for insomnia. Please refer to [facility]  MRI brain: This MRI of the brain with and without contrast shows the following:  1.   Brain parenchyma appears normal before and after contrast. 2.   7 to 8 mm focus in the right skull.  Surrounding skull appears normal.   This appears to be benign. She should repeat CT head in 6 months to a year to follow right skull focus, explained she should review with pcp and ask her to order as she may not need follow up in  neurology. At this time she is looking for a new pcp and understands it is her responsibility to follow up on this. MRI Thoracic Spine: This MRI of the thoracic spine with and without contrast shows the following: 1.   The spinal cord appears normal. 2.   Small disc protrusions at T7-T8, T8-T9 and T10-T11 that do not cause nerve root compression or spinal stenosis. 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 Showed a small pfo. Emailed Dr. Rayann Heman, no intervention needed.  Parkinsonism?: Pending DAT scan  Normal labs: Vit D, MMA, Lyme, tsh, HIV, esr, sjogrens, pan-anca, b12, folate, rpr, help c, paraneoplatic abs, celiac antibodies, heavy metals, b6, IFE/SPEP, copper, b1, cmo, cbc, ana   HPI:  Caitlin Braun is a 65 y.o. female here as requested by Dr. Erline Levine for low back pain.  She has been seen by [place] [facility] and EMG nerve conduction study of the right upper extremity did not show any etiology for her symptoms. She has been extensively evaluated at [place] with MRI c-spine and MRI L-spine and emg/ncs of the right arm. She has seen rheumatology. Here with her husband who provides much information. About 2 years she started feeling tired and lethargic, she was feeling joint pain diffuse. Then she started having right thumb pain and saw rheumatologist who allayed her fears about rheumatologic disease, diagnosed with hyper joints. She has had neck issues under her should blade on her right side and tingles and radiates down the right arm to the thumb. Massage and heat helps. She has some tremor in the right arm. Also when she sits on the toilet her right leg tremors. Also her handwriting is affected. She has weakness in the right hand, her hand goes numb, she can hardly write. She has weakness in the right hand. No problems with smell or taste, but he appetite has decreased and taste has changed per husband. No drooling or wet pillows at night. She has imbalance. No REM sleep  disorder. No falls. She denies incontinence, She mumbles a lot this is not new but husband has hearing difficulties, no hypophonia. Husband has noticed some shuffling. No changes in facial expressions. She has back pain and radiation into the right leg. Numbness in the right leg. No other focal neurologic deficits, associated symptoms, inciting events or modifiable factors.  Reviewed notes, labs and imaging from outside physicians, which showed:  Reviewed notes from Dr. Vertell Limber in his office neurosurgery and spine.:  Patient has been experiencing right upper extremity neck pain and also numbness and tingling in digits 1 and 2.  Diminished dexterity in the right hand.  Past medical history is otherwise noncontributory.  She has  right shoulder right arm and right some numbness and tingling.  Numbness is exacerbated by use.  She denies any neck pain.  She also notes some right leg numbness and tingling that begins in her right head and extends into her right outer toes.  She is noticed balance issues and handwriting issues over the last several months.  She also reports tremors in her right hand.  Her rheumatologist diagnosed hyperflexible right thumb and elevate her concern for autoimmune process.  On exam her neurologic exam is significant for decreased sensory in the right C6 distribution, Spurling's positive on the right, Tinel's positive on the right, Phalen's positive on the right.  She does have 4 out of 5 right hand intrinsics weakness and decreased pin sensitivity in the first and second digits on the right otherwise negative straight leg raise negative right sciatic notch discomfort.  Reviewed MRI of the cervical spine report I do not have imaging.  Impression is L4-L5 bilateral facet Orth osteoarthritis allowing 2 mm of anterolisthesis.  Mild bulging of the disc.  No compressive stenosis, the back arthritis would be a cause of back pain or referred pain.  Chronic appearing degenerative changes of the  discs at L3-L4 and L5-S1 but without herniation.  No stenosis or neural compression.  Exam date January 27, 2018.  MRI of the cervical spine reviewed report I do not have the images available: No significant degenerative changes, no stenosis or neural compression, no abnormality seen to explain neck pain and right arm symptoms.  T2 bright focus within the anterior right T1 vertebral body which may be a benign cyst would recommend, suggest a contrast-enhanced examination to rule out enhancing marrow space lesion.  Labs include normal thyroid panel, BUN 16 and creatinine 0.58 collected 03/25/2017, ANA negative, rheumatoid arthritis negative, sed rate negative, CEA normal, CA normal CBC was unremarkable April 11, 2017 except for mildly low platelets at 136.  Reviewed data and results of electrodiagnostic testing summary includes right upper extremity was within normal limits with no evidence for cervical radiculopathy, brachial plexopathy, peripheral median or ulnar neuropathy as well as polyneuropathy.  Per the note she could be experiencing some cervical radiculitis is not manifesting on electrodiagnostic testing, recommended MRI cervical spine.  Also recommended possibly repeating in the future.  Due to right arm numbness right arm weakness and right arm pain.     Review of Systems: Patient complains of symptoms per HPI as well as the following symptoms: laryngeal  swelling . Pertinent negatives and positives per HPI. All others negative    Social History   Socioeconomic History   Marital status: Married    Spouse name: Not on file   Number of children: 3   Years of education: Not on file   Highest education level: Bachelor's degree (e.g., BA, AB, BS)  Occupational History   Not on file  Tobacco Use   Smoking status: Never   Smokeless tobacco: Never  Vaping Use   Vaping Use: Some days   Substances: THC  Substance and Sexual Activity   Alcohol use: Yes    Alcohol/week: 3.0 standard  drinks    Types: 3 Standard drinks or equivalent per week    Comment: occasional   Drug use: Yes    Frequency: 3.0 times per week    Types: Marijuana   Sexual activity: Not on file  Other Topics Concern   Not on file  Social History Narrative   Lives at home with husband   Right handed  Caffeine: 1/2 cup "maybe" daily   Social Determinants of Health   Financial Resource Strain: Not on file  Food Insecurity: Not on file  Transportation Needs: Not on file  Physical Activity: Not on file  Stress: Not on file  Social Connections: Not on file  Intimate Partner Violence: Not on file    Family History  Problem Relation Age of Onset   Cancer Mother    Cancer Father    Cancer Brother    Other Daughter        carrier of Gauchers.   Breast cancer Neg Hx    Parkinson's disease Neg Hx     Past Medical History:  Diagnosis Date   GAD (generalized anxiety disorder)    Gauchers disease ([place])    Osteoporosis    Parkinson disease ([place])     Past Surgical History:  Procedure Laterality Date   ABDOMINAL HYSTERECTOMY     AUGMENTATION MAMMAPLASTY Bilateral 2016, 1998   Patient had them redone in 2016   COSMETIC SURGERY     face lift     NOSE SURGERY     cosmetic    Current Outpatient Medications  Medication Sig Dispense Refill   ALPRAZolam (XANAX) 0.5 MG tablet Take 1 tablet (0.5 mg total) by mouth 3 (three) times daily as needed. 90 tablet 5   Calcium Carb-Cholecalciferol (CALCIUM + D3 PO) Take 500 mg by mouth daily.     Carbidopa-Levodopa ER (SINEMET CR) 25-100 MG tablet controlled release Take 1 tablet by mouth 3 (three) times daily as needed. 270 tablet 3   CERDELGA 84 MG CAPS Take 84 mg by mouth 2 (two) times daily.     Coenzyme Q10 (CO Q-10) 100 MG CAPS Take by mouth.     denosumab (PROLIA) 60 MG/ML SOSY injection Inject 1 mL into the skin every 6 (six) months.     famotidine (PEPCID) 20 MG tablet TAKE 1 TABLET BY MOUTH TWICE A DAY 60 tablet 2   FLUTICASONE PROPIONATE  NA Place 1 spray into both nostrils at bedtime.     gabapentin (NEURONTIN) 300 MG capsule Take 1 capsule (300 mg total) by mouth 4 (four) times daily as needed. 360 capsule 1   hydroquinone 4 % cream Apply 1 application topically at bedtime.     ketotifen (ZADITOR) 0.025 % ophthalmic solution Place 1 drop into both eyes as needed.     levocetirizine (XYZAL) 5 MG tablet Take 5 mg by mouth daily.     Minoxidil (ROGAINE EX) Apply 2 drops topically daily.     mirtazapine (REMERON) 7.5 MG tablet TAKE 1 TABLET (7.5 MG TOTAL) BY MOUTH AT BEDTIME. 90 tablet 3   Multiple Vitamin (MULTIVITAMIN) tablet Take 1 tablet by mouth daily.     pantoprazole (PROTONIX) 40 MG tablet Take 1 tablet (40 mg total) by mouth daily. 30 tablet 1   Ropinirole HCl 12 MG TB24 TAKE 1 TABLET (12 MG TOTAL) BY MOUTH DAILY. 90 tablet 1   tretinoin (RETIN-A) 0.025 % cream Apply 1 application topically at bedtime as needed.     Wheat Dextrin (BENEFIBER PO) Take by mouth.     No current facility-administered medications for this visit.    Allergies as of 10/23/2020 - Review Complete 09/30/2020  Allergen Reaction Noted   Cerdelga [eliglustat] Anaphylaxis 10/23/2020   Augmentin [amoxicillin-pot clavulanate] Hives 03/02/2018   Perflutren lipid microsphere Other (See Comments) 03/24/2018   Neupro [rotigotine] Rash 05/18/2019    Vitals: BP 105/71   Pulse 85  Ht '5\' 9"'  (1.753 m)   Wt 132 lb (59.9 kg)   BMI 19.49 kg/m  Last Weight:  Wt Readings from Last 1 Encounters:  10/23/20 132 lb (59.9 kg)   Last Height:   Ht Readings from Last 1 Encounters:  10/23/20 '5\' 9"'  (1.753 m)   Physical exam: Stable Exam: Gen: NAD, conversant, well nourised, thin,well groomed                     CV: RRR, +SEM. No Carotid Bruits. No peripheral edema, warm, nontender Eyes: Conjunctivae clear without exudates or hemorrhage MSK: tall, thin, thin and long fingers, joint laxity  Neuro: Stable Detailed Neurologic Exam  Speech:    Speech is  normal; fluent and spontaneous with normal comprehension.  Cognition:    The patient is oriented to person, place, and time;     recent and remote memory intact;     language fluent;     normal attention, concentration,     fund of knowledge Cranial Nerves: Hypomimia    The pupils are equal, round, and reactive to light. Visual fields are full to finger confrontation. Extraocular movements are intact. Trigeminal sensation is intact and the muscles of mastication are normal. Bilateral ptosis. The palate elevates in the midline. Hearing intact. Voice is normal. Shoulder shrug is normal. The tongue has normal motion without fasciculations.    Gait:    Decreased right arm swing.   Motor Observation:    Postural tremor, no resting tremor Tone:    Normal muscle tone.   Posture:    Posture is normal. normal erect    Strength: Mild generalized weakness more proximaly 4+/5 in the deltoids and hip flexors, otherwise strength is V/V in the upper and lower limbs.      Sensation: intact to LT     Reflex Exam:  DTR's: right biceps slightly increased as compared to the left.  Brisk lowers for age.     Toes:    The toes are downgoing bilaterally.   Clonus:    2 beats clonus at AJs        Assessment/Plan:  65 year old female with multiple symptoms including chronic neck and back pain, radicular symptoms, fatigue, lethargy, diffuse pain, hyer joints, neck pain muscular, tremors, worsening weakness in right arm and hand and numbness, imbalance, hypomimia, decreased arm swing, numbnes sin right leg. She has already been extensively evaluated without causes found.  Extensive evaluation including MRI of the brain, MRI of the cervical/thoracic/lumbar spine, emg/ncs, lab testing, genetic testing, DaTscan revealed Parkinson's disease. Moving to [place].  - Patient was seen at [facility] and genetic testing and discovered she has Gaucher's disease. 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  She is ongoing with genetics at [facility] and follows with Dr. Deboraha Braun and movement disorders at [facility]. She is now on enzyme replacement. -Continue current medications and we will add an extra pill of the carbidopa levodopa, Dr. Deboraha Braun had started her on extended release at this time we will keep her on the extended release but may consider changing to IR, will see with Dr. Deboraha Braun does at next appointment. - We had a long discussion about the above, doing well on current medication will continue, will see what hematology and genetic counseling recommend.  - Discussed Gaucher disease, rare genetic disorder, associated conditions. Will refer to the proper specialists, she appears to be asymptomatic but there is treatment  in the form of an enzyme supplementation. - Hepatomegaly, maximum coronal span 23 cm from 11/03/2018, she is following with the [facility], hepatomegaly can be seen in Gaucher's disease. - Doing well on Requip, likes Neupro but skin rash made it intolerable, she was started on carbidopa levodopa extended release by Dr. Deboraha Braun awake, but feels it is wearing off, will add extra doses during the day.  We did discuss Rytary.  - Refill meds  - Discussed skin checks  - CT scan of the head with/w ocntrast to follow up on the skull lesion seen on MRI head. Stable.   - exercise, continue  - Extensive Parkinson's education provided, answered all questions today,  - 6  months in the office for follow-up here, she sees Dr. Deboraha Braun again in December.  No orders of the defined types were placed in this encounter.     PRIOR:   - Discussed her use of Ambien, to reports have shown association with Parkinson's disease, at this point will change to gabapentin/mirtazapine may help with anxiety too.  - Lumbar radiculopathy: feels better with exercise, f/u with Dr. Maryjean Ka if needed, she canceled her last shot bc she was feeling better.   Patient has lumbar radiculopathy, has seen Dr. Vertell Limber in the past for this, requesting epidural steroid injections or evaluation and treatment as clinically warranted with pain procedures.  Per Dr. Jana Braun: "Checking the Gaucher org site, there is one center in [place] [phone]) and one at the [facility] in [place] [phone])--perhaps your patient could start there; we then might be able to play a supportive role   Hope this helps! "  No orders of the defined types were placed in this encounter.    Addendum 06/03/2018: DAT Scan: IMPRESSION:Loss of dopamine transport activity in the bilateral striata is a pattern typical of Parkinson's syndrome pathology  Addendum 05/20/2018: Patient with reported low back pain with radiculopathy of right leg, right arm and leg weakness and sensory changes, diffuse pain. Reviewed workup with patient today. EMG/NCS within normal limits. She still has episodes of leg tremoring but she is feeling better and if she tells herself to stop she can stop it. She also still feels symptomatic in her right arm and right leg. But symptoms happen separately so may not be related. She went to physical therapy and she loves it. She is going to start Pilates. Reviewed MRI of the brain and thoracic imaging and labwork to date with patient which is extensive (see below). She has already has MRI cervical spine and lumbar spine at [facility]. Her worst symptoms are the tingling and numbness in the prox arm and legs and the tremor. She asks me for a refill of Ambien, I will give her one refill of ambien but I do not agree with long-term use so she will have to see her primary care to discuss. Needs a referral to a sleep counselor.  - Patient did well on Neupro but could not tolerate had rashes, now on oral dopamine agonists. - Patient was seen at [facility] and genetic testing and discovered she has Gaucher's disease. 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 Will refer to hematology and genetic counseling.  - We had a long discussion about the above, doing well on current medication will continue   MRI brain: This MRI of the brain with and without contrast shows the following:  1.   Brain parenchyma appears normal before  and after contrast. 2.   7 to 8 mm focus in the right skull.  Surrounding skull appears normal.   This appears to be benign. She should repeat CT head in 6 months to a year to follow right skull focus, explained she should review with pcp and ask her to order as she may not need follow up in neurology. At this time she is looking for a new pcp and understands it is her responsibility to follow up on this. MRI Thoracic Spine: This MRI of the thoracic spine with and without contrast shows the following: 1.   The spinal cord appears normal. 2.   Small disc protrusions at T7-T8, T8-T9 and T10-T11 that do not cause nerve root compression or spinal stenosis. 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 Showed a small pfo. Emailed Dr. Rayann Heman, no intervention needed.  Normal labs: Vit D, MMA, Lyme, tsh, HIV, esr, sjogrens, pan-anca, b12, folate, rpr, help c, paraneoplatic abs, celiac antibodies, heavy metals, b6, IFE/SPEP, copper, b1, cmo, cbc, ana  Initial Assessment and plan 03/02/2018:  - Need to evaluate for MS given diffuse symptoms. MRI brain and cervical spine w/wo contrast - MRI of the brain to evaluate for MS, stroke, masses or other lesions causing her focal neurologic symptoms. Also MRI cervical and thoracic spine w/wo contrast to evaluate for  T2 bright focus within the anterior right T1 vertebral body which may be a benign cyst but suggest a contrast-enhanced examination to rule out enhancing marrow space lesion or other lesions. - Right rhomboid and trap tightness, heat and massage feels good. Dry needling for cervical myofascial pain - EMG/NCS of the right arm and  right leg - Phenotypically she appears Marfan-like?: echocardiogram and f/u with pcp to discuss referral if clinically warranted and possibly genetic testing - Consider DAT scan, parkinsonism on exam - Tremor: may be essential tremor? MRI brain and cervical spine - Extensive lab testing today - Physical therapy for right-sided and pain and gait abnormality and proximal weakness: Mild generalized weakness more proximaly 4+/5 in the deltoids and hip flexors - A wrist splint to support right wrist and right thumb. - Xray of right hand  No orders of the defined types were placed in this encounter.   Cc:  Dr. Deniece Ree I spent 30 minutes of face-to-face and non-face-to-face time with patient on the  1. 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  This included previsit chart review, lab review, study review, order entry, electronic health record documentation, patient education on the different diagnostic and therapeutic options, counseling and coordination of care, risks and benefits of management, compliance, or risk factor reduction d benefits of management, compliance, or risk factor reduction  Sarina Ill, MD  [facility] [address] [place], [place] [address]  Phone [phone] Fax [phone]

## 2020-10-24 DIAGNOSIS — G4700 Insomnia, unspecified: Secondary | ICD-10-CM

## 2020-10-24 MED ORDER — ALPRAZOLAM 0.5 MG PO TABS: 0.5000 mg | ORAL_TABLET | Freq: Three times a day (TID) | ORAL | 5 refills | Status: AC | PRN

## 2020-10-24 MED ORDER — GABAPENTIN 300 MG PO CAPS: 300.0000 mg | ORAL_CAPSULE | Freq: Four times a day (QID) | ORAL | 1 refills | Status: AC | PRN

## 2020-10-24 MED ORDER — ROPINIROLE HCL ER 12 MG PO TB24: 12.0000 mg | ORAL_TABLET | Freq: Every day | ORAL | 1 refills | Status: AC

## 2020-10-24 MED ORDER — MIRTAZAPINE 7.5 MG PO TABS: 7.5000 mg | ORAL_TABLET | Freq: Every day | ORAL | 1 refills | Status: AC

## 2020-10-24 NOTE — Telephone Encounter (Signed)
Pt is going to be moving, trying to have refills available until 01/2021.  She has 2 availble from previous rx, (so was not sure if you wanted to do this sooner.  I did check on the other meds that she wanted and refilled all except Sinemet (she had 2 fills available to get thru - from pharmacy).

## 2020-10-30 MED ORDER — LANSOPRAZOLE 30 MG PO TBDD: 30.0000 mg | DELAYED_RELEASE_TABLET | Freq: Every day | ORAL | 11 refills | Status: AC

## 2020-10-30 NOTE — Telephone Encounter (Signed)
Lansoprazole 30 mg daily x30, 11 refills sent.

## 2020-10-30 NOTE — Telephone Encounter (Signed)
Called and spoke with patient. She verbalized understanding. She stated that she has been taking Pepcid for the past month and it has been working well for her. She denied any additional GERD issues. She is fearful to stop the Pepcid and start the Prevacid. For now, she will continue the Pepcid.   She also wanted to verify her appt for next month. I provided her with the appt info. She verbalized understanding.   Nothing further needed at time of call.

## 2020-10-30 NOTE — Telephone Encounter (Signed)
I called the plan and they stated that the insurance will not cover the protonix without her trying and failing omeprazole or lansoprazole.  MH please advise. Thanks

## 2020-11-06 ENCOUNTER — Ambulatory Visit: Payer: BC Managed Care – PPO | Admitting: Internal Medicine

## 2020-11-13 ENCOUNTER — Encounter: Payer: Self-pay | Admitting: Pulmonary Disease

## 2020-11-13 ENCOUNTER — Telehealth: Payer: Self-pay | Admitting: Pulmonary Disease

## 2020-11-13 ENCOUNTER — Other Ambulatory Visit: Payer: Self-pay

## 2020-11-13 ENCOUNTER — Ambulatory Visit: Payer: BC Managed Care – PPO | Admitting: Pulmonary Disease

## 2020-11-13 VITALS — BP 102/62 | HR 65 | Temp 97.3°F | Ht 69.5 in | Wt 130.1 lb

## 2020-11-13 DIAGNOSIS — R911 Solitary pulmonary nodule: Secondary | ICD-10-CM

## 2020-11-13 NOTE — Telephone Encounter (Signed)
Pt calling because CT appt without calling pt or asking if the schedule worked for her or not.Pt needs to be scheduled for CT at [facility] in [place].This is convenient to her work and insurance is accepted. Please advise [phone] Pt available to do ct at [facility] anytime tomorrow, Thursday would need around 1:00

## 2020-11-13 NOTE — Telephone Encounter (Signed)
Caitlin Braun does not perform super d CTs.  Pt will call me in the AM & let me know her preference for location.

## 2020-11-13 NOTE — Telephone Encounter (Addendum)
I am working on this. I had to leave a VM for a referral coordinator to call me back.

## 2020-11-13 NOTE — Telephone Encounter (Signed)
Per pt's request, scheduling at next available.  This was at [facility], [place] [address], [address], for 8/12 @ 10, check in by 9:40.  Pt given appt info.  Nothing further needed at this time.

## 2020-11-13 NOTE — Patient Instructions (Addendum)
Nice to see you again!  We will repeat a CT of the chest to check on that nodule seen in June. Based on the appearance, we will decide next steps.   Follow up determined by results of CT scan

## 2020-11-13 NOTE — Progress Notes (Signed)
$'@Patient'D$  ID: Caitlin Braun, female    DOB: 01/14/1956, 65 y.o.   MRN: [id number]  Chief Complaint  Patient presents with   Consult    Nodule in lung    Referring provider: Isaac Bliss, [place]*  HPI:   65 year old woman whom we are seeing in consultation for evaluation of lung nodule.  Discharge summary from hospitalization reviewed.  Patient admitted to the hospital 09/2020 with dyspnea exertion, throat pain after swallowing pill.  Developed worsening shortness of breath.  Reportedly stridulous in the ED.  Received a laryngoscopy fiberoptic with some narrowing.  Was placed on steroids.  Swallowing, breathing rapidly improved.  Felt related to chemical irritation from pill.  Has been diagnosed with positive home COVID test about 7 to 10 days prior.  Symptoms from [place] had improved.  On CT neck that demonstrated narrowing and concern for epiglottitis, very faint approximately 1-1/2 cm groundglass nodule noted in the upper periphery of the right upper lobe on my interpretation.  She never smoked cigarettes.  Occasional marijuana use with Parkinson's.  Notes Father diagnosed with lung cancer in his 43s and lived another 15+ years socially diagnosed with malignancy in the abdomen.  Brother was diagnosed with lung cancer in his early 4s, 30.  Mother also had leiomyosarcoma of the uterus but lived 81 years thereafter.  PMH: Parkinson's Surgical history: Hysterectomy, breast augmentation Family history: Father diagnosed with lung cancer in his 47s, brother diagnosed with lung cancers and 107s, both smokers Social history: Lives in [place], never smoked cigarettes  Questionaires / Pulmonary Flowsheets:   ACT:  No flowsheet data found.  MMRC: No flowsheet data found.  Epworth:  No flowsheet data found.  Tests:   FENO:  No results found for: NITRICOXIDE  PFT: No flowsheet data found.  WALK:  No flowsheet data found.  Imaging: Personally reviewed and as per EMR and  discussion in this note  Lab Results: Personally reviewed CBC    Component Value Date/Time   WBC 11.4 (H) 09/30/2020 0349   RBC 3.80 (L) 09/30/2020 0349   HGB 11.1 (L) 09/30/2020 0349   HGB 11.8 03/02/2018 1045   HCT 33.9 (L) 09/30/2020 0349   HCT 36.5 03/02/2018 1045   PLT 131 (L) 09/30/2020 0349   PLT 146 (L) 03/02/2018 1045   MCV 89.2 09/30/2020 0349   MCV 88 03/02/2018 1045   MCH 29.2 09/30/2020 0349   MCHC 32.7 09/30/2020 0349   RDW 13.6 09/30/2020 0349   RDW 13.7 03/02/2018 1045   LYMPHSABS 1.2 09/07/2019 1013   MONOABS 0.4 09/07/2019 1013   EOSABS 0.0 09/07/2019 1013   BASOSABS 0.0 09/07/2019 1013    BMET    Component Value Date/Time   NA 139 09/30/2020 0349   NA 141 03/02/2018 1045   K 3.7 09/30/2020 0349   CL 104 09/30/2020 0349   CO2 26 09/30/2020 0349   GLUCOSE 157 (H) 09/30/2020 0349   BUN 12 09/30/2020 0349   BUN 17 03/02/2018 1045   CREATININE 0.50 09/30/2020 0349   CALCIUM 9.5 09/30/2020 0349   GFRNONAA >60 09/30/2020 0349   GFRAA >60 11/03/2018 1552    BNP No results found for: BNP  ProBNP No results found for: PROBNP  Specialty Problems       Pulmonary Problems   Supraglottitis without airway obstruction    Allergies  Allergen Reactions   Cerdelga [Eliglustat] Anaphylaxis   Augmentin [Amoxicillin-Pot Clavulanate] Hives   Perflutren Lipid Microsphere Other (See Comments)    Back pain  Neupro [Rotigotine] Rash    Allergic to the adhesive     Immunization History  Administered Date(s) Administered   Influenza Inj Mdck Quad Pf 12/11/2017   Influenza, High Dose Seasonal PF 05/05/2017, 07/11/2019   Influenza, Seasonal, Injecte, Preservative Fre 12/01/2013   Influenza,inj,Quad PF,6+ Mos 12/01/2018   PFIZER(Purple Top)SARS-COV-2 Vaccination 06/06/2019, 07/05/2019   Tdap 05/06/2017   Unspecified SARS-COV-2 Vaccination 06/06/2019, 07/05/2019, 11/23/2019, 07/04/2020   Zoster Recombinat (Shingrix) 06/24/2017, 10/30/2017    Past  Medical History:  Diagnosis Date   GAD (generalized anxiety disorder)    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   Calcium Carb-Cholecalciferol (CALCIUM + D3 PO) Take 500 mg by mouth daily.   Carbidopa-Levodopa ER (SINEMET CR) 25-100 MG tablet controlled release Take 1 tablet by mouth 3 (three) times daily as needed.   CERDELGA 84 MG CAPS Take 84 mg by mouth 2 (two) times daily.   Coenzyme Q10 (CO Q-10) 100 MG CAPS Take by mouth.   denosumab (PROLIA) 60 MG/ML SOSY injection Inject 1 mL into the skin every 6 (six) months.   famotidine (PEPCID) 20 MG tablet TAKE 1 TABLET BY MOUTH TWICE A DAY   FLUTICASONE PROPIONATE NA Place 1 spray into both nostrils at bedtime.   gabapentin (NEURONTIN) 300 MG capsule Take 1 capsule (300 mg total) by mouth 4 (four) times daily as needed.   hydroquinone 4 % cream Apply 1 application topically at bedtime.   ketotifen (ZADITOR) 0.025 % ophthalmic solution Place 1 drop into both eyes as needed.   lansoprazole (PREVACID SOLUTAB) 30 MG disintegrating tablet Take 1 tablet (30 mg total) by mouth daily. Before breakfast.   levocetirizine (XYZAL) 5 MG tablet Take 5 mg by mouth daily.   Minoxidil (ROGAINE EX) Apply 2 drops topically daily.   mirtazapine (REMERON) 7.5 MG tablet Take 1 tablet (7.5 mg total) by mouth at bedtime.   Multiple Vitamin (MULTIVITAMIN) tablet Take 1 tablet by mouth daily.   Ropinirole HCl 12 MG TB24 Take 1 tablet (12 mg total) by mouth daily.   tretinoin (RETIN-A) 0.025 % cream Apply 1 application topically at bedtime as needed.   Wheat Dextrin (BENEFIBER PO) Take by mouth.   No  facility-administered encounter medications on file as of 11/13/2020.     Review of Systems  Review of Systems  No chest pain with exertion.  No dyspnea on exertion.  No orthopnea or PND.  Comprehensive review of systems otherwise negative.  But Physical Exam  BP 102/62 (BP Location: Left Arm, Cuff Size: Normal)   Pulse 65   Temp (!) 97.3 F (36.3 C) (Oral)   Ht 5' 9.5" (1.765 m)   Wt 130 lb 1.6 oz (59 kg)   SpO2 100%   BMI 18.94 kg/m   Wt Readings from Last 5 Encounters:  11/13/20 130 lb 1.6 oz (59 kg)  10/23/20 132 lb (59.9 kg)  09/30/20 125 lb (56.7 kg)  08/21/20 132 lb 14.4 oz (60.3 kg)  05/31/20 129 lb (58.5 kg)    BMI Readings from Last 5 Encounters:  11/13/20 18.94 kg/m  10/23/20 19.49 kg/m  09/30/20 18.46 kg/m  08/21/20 19.34 kg/m  05/31/20 18.78 kg/m     Physical Exam Gen: Well-appearing, no acute distress Eyes: EOMI, no icterus Neck: Supple, no JVD appreciated Cardiovascular: Regular rhythm, no murmurs Pulmonary: Clear to oscillation bilaterally, no wheezes or crackles, normal work of breathing Abdomen: Nondistended, bowel sounds present MSK: No synovitis, joint effusion Neuro: Normal gait, no overt weakness Psych: Normal mood, full affect   Assessment & Plan:   Lung Nodule: Faint RUL GGO in periphery. Suspect related to COVID infection in days preceding CT neck where nodule identified. 3.6% malignancy risk based on Mayo calculator. Father and brother had lung cancer. Will repeat CT scan as has been about 6 weeks since COVID. If persistent or enlarging will plan to arrange biopsy.    Epiglottitis: 09/2020 in setting of pill aspiration. Suspect chemical irritation. Resolved. No further issues.  Return if symptoms worsen or fail to improve.   Lanier Clam, MD 11/13/2020

## 2020-11-15 ENCOUNTER — Other Ambulatory Visit: Payer: BC Managed Care – PPO

## 2020-11-16 ENCOUNTER — Other Ambulatory Visit: Payer: Self-pay

## 2020-11-16 ENCOUNTER — Ambulatory Visit
Admission: RE | Admit: 2020-11-16 | Discharge: 2020-11-16 | Disposition: A | Discharge status: Home / Self Care | Payer: BC Managed Care – PPO | Source: Ambulatory Visit | Attending: Pulmonary Disease | Admitting: Pulmonary Disease

## 2020-11-16 DIAGNOSIS — R911 Solitary pulmonary nodule: Secondary | ICD-10-CM

## 2020-11-18 DIAGNOSIS — G20 Parkinson's disease: Secondary | ICD-10-CM

## 2020-11-20 ENCOUNTER — Other Ambulatory Visit: Payer: Self-pay

## 2020-11-20 ENCOUNTER — Ambulatory Visit
Admission: RE | Admit: 2020-11-20 | Discharge: 2020-11-20 | Disposition: A | Discharge status: Home / Self Care | Payer: BC Managed Care – PPO | Source: Ambulatory Visit

## 2020-11-20 DIAGNOSIS — Z1231 Encounter for screening mammogram for malignant neoplasm of breast: Secondary | ICD-10-CM

## 2020-11-20 NOTE — Telephone Encounter (Signed)
Hello Dr. Silas Flood, please advise on my chart message below, the scan was done on 11/16/20, I did inform the patient about the 4 days given for providers to give results. Thanks!  Wanted to go over scan results.

## 2020-11-20 NOTE — Telephone Encounter (Signed)
Hello Dr. Silas Flood,  Please see reply from patient on mychart message below, thanks!  Hi,   I would be fine with whatever the Doctors feel I should do.   Abigail Butts

## 2020-11-21 NOTE — Progress Notes (Signed)
Persistent RUL GGO. Strong family history of cancer. Given peripheral location, < 2 cm, no LAD, and GGO on imaging, recommend thoracic surgery consult for consideration of resection. Called and discussed this recommendation with patient. She is in process of move and relatively unsettled. She has copy of film and is consulting with son who is pulmonologist. She will contact me when ready to move forward with thoracic surgery consult. She may prefer to continue care closer to new home in [place].  Routing to office staff as Juluis Rainier.

## 2020-11-29 ENCOUNTER — Encounter: Payer: Self-pay | Admitting: Internal Medicine

## 2020-12-04 ENCOUNTER — Encounter: Payer: Self-pay | Admitting: *Deleted

## 2020-12-05 ENCOUNTER — Ambulatory Visit: Payer: 59 | Admitting: Internal Medicine

## 2020-12-07 ENCOUNTER — Other Ambulatory Visit: Payer: Self-pay

## 2020-12-07 ENCOUNTER — Ambulatory Visit (INDEPENDENT_AMBULATORY_CARE_PROVIDER_SITE_OTHER): Payer: BC Managed Care – PPO

## 2020-12-07 ENCOUNTER — Encounter: Payer: BC Managed Care – PPO | Admitting: Thoracic Surgery (Cardiothoracic Vascular Surgery)

## 2020-12-07 ENCOUNTER — Ambulatory Visit: Payer: BC Managed Care – PPO | Admitting: Pulmonary Disease

## 2020-12-07 DIAGNOSIS — M810 Age-related osteoporosis without current pathological fracture: Secondary | ICD-10-CM | POA: Diagnosis not present

## 2020-12-07 MED ORDER — DENOSUMAB 60 MG/ML ~~LOC~~ SOSY
60.0000 mg | PREFILLED_SYRINGE | Freq: Once | SUBCUTANEOUS | Status: AC
  Administered 2020-12-07: 60 mg via SUBCUTANEOUS

## 2020-12-07 NOTE — Progress Notes (Signed)
Per orders of Dr. Hernandez, injection of Denosumab 60 mg/ml given by Grady Mohabir L Kaesha Kirsch. Patient tolerated injection well.  

## 2020-12-26 ENCOUNTER — Ambulatory Visit: Payer: 59

## 2021-01-10 ENCOUNTER — Ambulatory Visit: Payer: 59 | Admitting: Neurology

## 2021-01-17 ENCOUNTER — Ambulatory Visit: Payer: BC Managed Care – PPO | Admitting: Neurology

## 2021-02-22 ENCOUNTER — Other Ambulatory Visit: Payer: Self-pay | Admitting: Pulmonary Disease

## 2021-02-22 MED ORDER — FAMOTIDINE 20 MG PO TABS: 20.0000 mg | ORAL_TABLET | Freq: Two times a day (BID) | ORAL | 3 refills | Status: AC

## 2021-02-22 NOTE — Telephone Encounter (Signed)
Medication refill

## 2021-03-18 ENCOUNTER — Telehealth: Payer: Self-pay | Admitting: *Deleted

## 2021-03-18 NOTE — Telephone Encounter (Signed)
tried reaching patient several times, pt needed PFT & PET before we sch appt,

## 2021-04-08 ENCOUNTER — Other Ambulatory Visit: Payer: Self-pay | Admitting: Neurology

## 2021-04-27 ENCOUNTER — Other Ambulatory Visit: Payer: Self-pay | Admitting: Neurology

## 2021-05-20 ENCOUNTER — Encounter: Payer: Self-pay | Admitting: Internal Medicine

## 2021-06-10 NOTE — Telephone Encounter (Signed)
FYI

## 2021-08-21 ENCOUNTER — Telehealth: Payer: Self-pay | Admitting: *Deleted

## 2021-08-21 NOTE — Telephone Encounter (Signed)
I mailed pt datscan and report/cd on 08/21/21 to pt home address in [place].  ?

## 2021-10-28 ENCOUNTER — Other Ambulatory Visit: Payer: Self-pay | Admitting: Obstetrics and Gynecology

## 2021-10-28 DIAGNOSIS — Z1231 Encounter for screening mammogram for malignant neoplasm of breast: Secondary | ICD-10-CM

## 2021-12-17 ENCOUNTER — Ambulatory Visit
Admission: RE | Admit: 2021-12-17 | Discharge: 2021-12-17 | Disposition: A | Discharge status: Home / Self Care | Payer: Medicare Other | Source: Ambulatory Visit | Attending: Obstetrics and Gynecology | Admitting: Obstetrics and Gynecology

## 2021-12-17 DIAGNOSIS — Z1231 Encounter for screening mammogram for malignant neoplasm of breast: Secondary | ICD-10-CM

## 2021-12-19 ENCOUNTER — Other Ambulatory Visit: Payer: Self-pay | Admitting: Obstetrics and Gynecology

## 2021-12-19 DIAGNOSIS — R928 Other abnormal and inconclusive findings on diagnostic imaging of breast: Secondary | ICD-10-CM

## 2021-12-26 ENCOUNTER — Encounter (HOSPITAL_BASED_OUTPATIENT_CLINIC_OR_DEPARTMENT_OTHER): Payer: PRIVATE HEALTH INSURANCE | Admitting: Radiology

## 2021-12-26 DIAGNOSIS — Z1231 Encounter for screening mammogram for malignant neoplasm of breast: Secondary | ICD-10-CM
